# Patient Record
Sex: Female | Born: 1995
Health system: Southern US, Community
[De-identification: ages and names within clinical notes are randomized; demographics above are authoritative.]

## PROBLEM LIST (undated history)

## (undated) DIAGNOSIS — Z789 Other specified health status: Secondary | ICD-10-CM

## (undated) HISTORY — PX: TONSILLECTOMY: SUR1361

---

## 2014-09-13 ENCOUNTER — Emergency Department (HOSPITAL_COMMUNITY)
Admission: EM | Admit: 2014-09-13 | Discharge: 2014-09-14 | Disposition: A | Discharge status: Home / Self Care | Attending: Emergency Medicine | Admitting: Emergency Medicine

## 2014-09-13 ENCOUNTER — Encounter (HOSPITAL_COMMUNITY): Payer: Self-pay | Admitting: Emergency Medicine

## 2014-09-13 DIAGNOSIS — R059 Cough, unspecified: Secondary | ICD-10-CM

## 2014-09-13 DIAGNOSIS — J029 Acute pharyngitis, unspecified: Secondary | ICD-10-CM | POA: Diagnosis present

## 2014-09-13 DIAGNOSIS — M542 Cervicalgia: Secondary | ICD-10-CM | POA: Diagnosis not present

## 2014-09-13 DIAGNOSIS — R51 Headache: Secondary | ICD-10-CM | POA: Insufficient documentation

## 2014-09-13 DIAGNOSIS — J069 Acute upper respiratory infection, unspecified: Secondary | ICD-10-CM | POA: Insufficient documentation

## 2014-09-13 DIAGNOSIS — R05 Cough: Secondary | ICD-10-CM

## 2014-09-13 DIAGNOSIS — R0981 Nasal congestion: Secondary | ICD-10-CM

## 2014-09-13 LAB — RAPID STREP SCREEN (MED CTR MEBANE ONLY): Streptococcus, Group A Screen (Direct): NEGATIVE

## 2014-09-13 NOTE — ED Notes (Signed)
Pt. reports sore throat with dry cough and runny nose onset last week . Denies fever or chills. Airway intact / respirations unlabored .

## 2014-09-14 MED ORDER — SODIUM CHLORIDE-SODIUM BICARB 2300-700 MG NA KIT: 1.0000 "application " | PACK | Freq: Three times a day (TID) | NASAL | Status: DC | PRN

## 2014-09-14 MED ORDER — FLUTICASONE PROPIONATE 50 MCG/ACT NA SUSP: 2.0000 | Freq: Every day | NASAL | Status: DC

## 2014-09-14 NOTE — ED Provider Notes (Signed)
CSN: [id number]     Arrival date & time 09/13/14  2217 History   First MD Initiated Contact with Patient 09/13/14 2337     Chief Complaint  Patient presents with  . Sore Throat     (Consider location/radiation/quality/duration/timing/severity/associated sxs/prior Treatment) HPI Comments: Mariah Barnett is a 18 y.o. female with no significant PMHx, and a PSHx of T&A, who presents to the ED with complains of one week of URI symptoms consisting of a sore throat, clear rhinorrhea, dry cough, neck pain with LAD, sinus congestion, and mild intermittent headache. She'll force that her throat is 6/10 constant scratchy nonradiating pain worse with swallowing and unrelieved by Robitussin, Tylenol, and halls. She denies any fevers, chills, drooling, trismus, voice changes, eye pain or itching, eye discharge, ear pain or discharge, wheezing, chest pain, shortness of breath, sputum production, abdominal pain, nausea, vomiting, diarrhea, myalgias, arthralgias, or rashes. She has no known sick contacts, but she does attend school. UTD on shots. No medical issues or immunosuppressants.  Patient is a 18 y.o. female presenting with URI. The history is provided by the patient. No language interpreter was used.  URI Presenting symptoms: congestion, cough, rhinorrhea and sore throat   Presenting symptoms: no ear pain, no facial pain, no fatigue and no fever   Congestion:    Location:  Nasal Cough:    Cough characteristics:  Dry   Severity:  Mild   Onset quality:  Gradual   Duration:  1 week   Timing:  Intermittent   Progression:  Unchanged   Chronicity:  New Rhinorrhea:    Quality:  Clear   Severity:  Mild   Duration:  1 week   Timing:  Constant   Progression:  Unchanged Sore throat:    Severity:  Mild   Onset quality:  Gradual   Duration:  1 week   Timing:  Constant   Progression:  Unchanged Severity:  Mild Onset quality:  Gradual Duration:  1 week Timing:  Constant Progression:   Unchanged Chronicity:  New Relieved by:  Nothing Worsened by:  Drinking Ineffective treatments:  OTC medications and decongestant (tylenol and halls and robitussin) Associated symptoms: headaches (mild, frontal), neck pain, sinus pain and swollen glands   Associated symptoms: no arthralgias, no myalgias, no sneezing and no wheezing   Risk factors: no chronic respiratory disease, no immunosuppression, no recent illness, no recent travel and no sick contacts     History reviewed. No pertinent past medical history. History reviewed. No pertinent past surgical history. No family history on file. History  Substance Use Topics  . Smoking status: Never Smoker   . Smokeless tobacco: Not on file  . Alcohol Use: No   OB History    No data available     Review of Systems  Constitutional: Negative for fever, chills, appetite change and fatigue.  HENT: Positive for congestion, rhinorrhea and sore throat. Negative for dental problem, ear discharge, ear pain, mouth sores, postnasal drip, sinus pressure, sneezing, tinnitus, trouble swallowing and voice change.   Eyes: Negative for photophobia, pain, discharge, redness, itching and visual disturbance.  Respiratory: Positive for cough. Negative for chest tightness, shortness of breath and wheezing.   Cardiovascular: Negative for chest pain.  Gastrointestinal: Negative for nausea, vomiting and abdominal pain.  Musculoskeletal: Positive for neck pain. Negative for myalgias, arthralgias and neck stiffness.  Skin: Negative for rash.  Neurological: Positive for headaches (mild, frontal). Negative for dizziness, syncope, weakness, light-headedness and numbness.  Hematological: Positive for adenopathy.  Psychiatric/Behavioral: Negative  for confusion.   10 Systems reviewed and are negative for acute change except as noted in the HPI.    Allergies  Review of patient's allergies indicates no known allergies.  Home Medications   Prior to Admission  medications   Not on File   BP 122/73 mmHg  Pulse 78  Temp(Src) 98.5 F (36.9 C) (Oral)  Resp 16  Ht _0  (1.702 m)  Wt 174 lb (78.926 kg)  BMI 27.25 kg/m2  SpO2 100%  LMP 09/06/2014 Physical Exam  Constitutional: She is oriented to person, place, and time. Vital signs are normal. She appears well-developed and well-nourished.  Non-toxic appearance. No distress.  Afebrile, nontoxic, NAD  HENT:  Head: Normocephalic and atraumatic.  Right Ear: Hearing, tympanic membrane, external ear and ear canal normal.  Left Ear: Hearing, tympanic membrane, external ear and ear canal normal.  Nose: Mucosal edema and rhinorrhea present. No sinus tenderness. Right sinus exhibits no maxillary sinus tenderness and no frontal sinus tenderness. Left sinus exhibits no maxillary sinus tenderness and no frontal sinus tenderness.  Mouth/Throat: Uvula is midline and mucous membranes are normal. No trismus in the jaw. No uvula swelling. Posterior oropharyngeal erythema present.  Ears clear bilaterally B/l nasal turbinates edematous and erythematous with minimal clear rhinorrhea, no TTP over sinuses Oropharynx without tonsils, minimally injected without exudates, no PTA. MMM. No trismus or drooling, no uvular swelling or deviation.  Eyes: Conjunctivae and EOM are normal. Pupils are equal, round, and reactive to light. Right eye exhibits no discharge. Left eye exhibits no discharge.  Neck: Normal range of motion. Neck supple.  Cardiovascular: Normal rate, regular rhythm, normal heart sounds and intact distal pulses.  Exam reveals no gallop and no friction rub.   No murmur heard. Pulmonary/Chest: Effort normal and breath sounds normal. No respiratory distress. She has no decreased breath sounds. She has no wheezes. She has no rhonchi. She has no rales.  CTAB in all lung fields  Abdominal: Soft. Normal appearance and bowel sounds are normal. She exhibits no distension. There is no tenderness. There is no rigidity,  no rebound, no guarding, no tenderness at McBurney's point and negative Murphy's sign.  Musculoskeletal: Normal range of motion.  Lymphadenopathy:       Head (right side): Tonsillar adenopathy present.       Head (left side): Tonsillar adenopathy present.    She has cervical adenopathy.  Minimal shotty anterior superficial cervical and tonsillar LAD, mildly TTP  Neurological: She is alert and oriented to person, place, and time. She has normal strength. No sensory deficit.  Skin: Skin is warm, dry and intact. No rash noted.  Psychiatric: She has a normal mood and affect.  Nursing note and vitals reviewed.   ED Course  Procedures (including critical care time) Labs Review Labs Reviewed  RAPID STREP SCREEN  CULTURE, GROUP A STREP    Imaging Review No results found.   EKG Interpretation None      MDM   Final diagnoses:  Nasal sinus congestion  Sore throat  Viral URI  Cough    18y/o female with URI symptoms. Pt has no tonsils but RST done in triage, and was neg. Highly doubt strep, will not empirically tx. Pt is afebrile with a clear lung exam. Mild rhinorrhea with sinus congestion. Likely viral URI. Pt is agreeable to symptomatic treatment with close follow up with PCP as needed but spoke at length about emergent changing or worsening of symptoms that should prompt return to ER. Pt voices  understanding and is agreeable to plan. Stable at time of discharge.   BP 122/73 mmHg  Pulse 78  Temp(Src) 98.5 F (36.9 C) (Oral)  Resp 16  Ht _0  (1.702 m)  Wt 174 lb (78.926 kg)  BMI 27.25 kg/m2  SpO2 100%  LMP 09/06/2014  Meds ordered this encounter  Medications  . Sodium Chloride-Sodium Bicarb (NETI POT SINUS WASH) 2300-700 MG KIT    Sig: Place 1 application into the nose 3 (three) times daily as needed (sinus congestion).    Dispense:  1 each    Refill:  2    Order Specific Question:  Supervising Provider    Answer:  Noemi Chapel D [[id number]]  . fluticasone (FLONASE) 50  MCG/ACT nasal spray    Sig: Place 2 sprays into both nostrils daily.    Dispense:  16 g    Refill:  0    Order Specific Question:  Supervising Provider    Answer:  Johnna Acosta [address] [place], PA-C 09/14/14 [id number]  Everlene Balls, MD 09/14/14 [phone]

## 2014-09-14 NOTE — ED Notes (Signed)
Patient states she has had a cough and runny nose for about 1 week.  Denies fever  Denies difficulty breathing.

## 2014-09-14 NOTE — Discharge Instructions (Signed)
Continue to stay well-hydrated. Gargle warm salt water and spit it out. Continue to alternate between Tylenol and Ibuprofen for pain or fever. May use Mucinex for cough suppression/expectoration of mucus. Use netipot and flonase to help with nasal congestion. May consider over-the-counter Benadryl or other antihistamine to decrease secretions and for watery itchy eyes. Followup with your primary care doctor or campus clinic doctor in 5-7 days for recheck of ongoing symptoms. Return to emergency department for emergent changing or worsening of symptoms.   Upper Respiratory Infection, Adult An upper respiratory infection (URI) is also sometimes known as the common cold. The upper respiratory tract includes the nose, sinuses, throat, trachea, and bronchi. Bronchi are the airways leading to the lungs. Most people improve within 1 week, but symptoms can last up to 2 weeks. A residual cough may last even longer.  CAUSES Many different viruses can infect the tissues lining the upper respiratory tract. The tissues become irritated and inflamed and often become very moist. Mucus production is also common. A cold is contagious. You can easily spread the virus to others by oral contact. This includes kissing, sharing a glass, coughing, or sneezing. Touching your mouth or nose and then touching a surface, which is then touched by another person, can also spread the virus. SYMPTOMS  Symptoms typically develop 1 to 3 days after you come in contact with a cold virus. Symptoms vary from person to person. They may include:  Runny nose.  Sneezing.  Nasal congestion.  Sinus irritation.  Sore throat.  Loss of voice (laryngitis).  Cough.  Fatigue.  Muscle aches.  Loss of appetite.  Headache.  Low-grade fever. DIAGNOSIS  You might diagnose your own cold based on familiar symptoms, since most people get a cold 2 to 3 times a year. Your caregiver can confirm this based on your exam. Most importantly, your  caregiver can check that your symptoms are not due to another disease such as strep throat, sinusitis, pneumonia, asthma, or epiglottitis. Blood tests, throat tests, and X-rays are not necessary to diagnose a common cold, but they may sometimes be helpful in excluding other more serious diseases. Your caregiver will decide if any further tests are required. RISKS AND COMPLICATIONS  You may be at risk for a more severe case of the common cold if you smoke cigarettes, have chronic heart disease (such as heart failure) or lung disease (such as asthma), or if you have a weakened immune system. The very young and very old are also at risk for more serious infections. Bacterial sinusitis, middle ear infections, and bacterial pneumonia can complicate the common cold. The common cold can worsen asthma and chronic obstructive pulmonary disease (COPD). Sometimes, these complications can require emergency medical care and may be life-threatening. PREVENTION  The best way to protect against getting a cold is to practice good hygiene. Avoid oral or hand contact with people with cold symptoms. Wash your hands often if contact occurs. There is no clear evidence that vitamin C, vitamin E, echinacea, or exercise reduces the chance of developing a cold. However, it is always recommended to get plenty of rest and practice good nutrition. TREATMENT  Treatment is directed at relieving symptoms. There is no cure. Antibiotics are not effective, because the infection is caused by a virus, not by bacteria. Treatment may include:  Increased fluid intake. Sports drinks offer valuable electrolytes, sugars, and fluids.  Breathing heated mist or steam (vaporizer or shower).  Eating chicken soup or other clear broths, and maintaining good  nutrition.  Getting plenty of rest.  Using gargles or lozenges for comfort.  Controlling fevers with ibuprofen or acetaminophen as directed by your caregiver.  Increasing usage of your  inhaler if you have asthma. Zinc gel and zinc lozenges, taken in the first 24 hours of the common cold, can shorten the duration and lessen the severity of symptoms. Pain medicines may help with fever, muscle aches, and throat pain. A variety of non-prescription medicines are available to treat congestion and runny nose. Your caregiver can make recommendations and may suggest nasal or lung inhalers for other symptoms.  HOME CARE INSTRUCTIONS   Only take over-the-counter or prescription medicines for pain, discomfort, or fever as directed by your caregiver.  Use a warm mist humidifier or inhale steam from a shower to increase air moisture. This may keep secretions moist and make it easier to breathe.  Drink enough water and fluids to keep your urine clear or pale yellow.  Rest as needed.  Return to work when your temperature has returned to normal or as your caregiver advises. You may need to stay home longer to avoid infecting others. You can also use a face mask and careful hand washing to prevent spread of the virus. SEEK MEDICAL CARE IF:   After the first few days, you feel you are getting worse rather than better.  You need your caregiver's advice about medicines to control symptoms.  You develop chills, worsening shortness of breath, or brown or red sputum. These may be signs of pneumonia.  You develop yellow or brown nasal discharge or pain in the face, especially when you bend forward. These may be signs of sinusitis.  You develop a fever, swollen neck glands, pain with swallowing, or white areas in the back of your throat. These may be signs of strep throat. SEEK IMMEDIATE MEDICAL CARE IF:   You have a fever.  You develop severe or persistent headache, ear pain, sinus pain, or chest pain.  You develop wheezing, a prolonged cough, cough up blood, or have a change in your usual mucus (if you have chronic lung disease).  You develop sore muscles or a stiff neck. Document  Released: 04/14/2001 Document Revised: 01/11/2012 Document Reviewed: 01/24/2014 [facility] Patient Information 2015 [place], [place]. This information is not intended to replace advice given to you by your health care provider. Make sure you discuss any questions you have with your health care provider.  Sore Throat A sore throat is a painful, burning, sore, or scratchy feeling of the throat. There may be pain or tenderness when swallowing or talking. You may have other symptoms with a sore throat. These include coughing, sneezing, fever, or a swollen neck. A sore throat is often the first sign of another sickness. These sicknesses may include a cold, flu, strep throat, or an infection called mono. Most sore throats go away without medical treatment.  HOME CARE   Only take medicine as told by your doctor.  Drink enough fluids to keep your pee (urine) clear or pale yellow.  Rest as needed.  Try using throat sprays, lozenges, or suck on hard candy (if older than 4 years or as told).  Sip warm liquids, such as broth, herbal tea, or warm water with honey. Try sucking on frozen ice pops or drinking cold liquids.  Rinse the mouth (gargle) with salt water. Mix 1 teaspoon salt with 8 ounces of water.  Do not smoke. Avoid being around others when they are smoking.  Put a humidifier in  your bedroom at night to moisten the air. You can also turn on a hot shower and sit in the bathroom for 5-10 minutes. Be sure the bathroom door is closed. GET HELP RIGHT AWAY IF:   You have trouble breathing.  You cannot swallow fluids, soft foods, or your spit (saliva).  You have more puffiness (swelling) in the throat.  Your sore throat does not get better in 7 days.  You feel sick to your stomach (nauseous) and throw up (vomit).  You have a fever or lasting symptoms for more than 2-3 days.  You have a fever and your symptoms suddenly get worse. MAKE SURE YOU:   Understand these instructions.  Will watch  your condition.  Will get help right away if you are not doing well or get worse. Document Released: 07/28/2008 Document Revised: 07/13/2012 Document Reviewed: 06/26/2012 [facility] Patient Information 2015 [place], [place]. This information is not intended to replace advice given to you by your health care provider. Make sure you discuss any questions you have with your health care provider.  Salt Water Gargle This solution will help make your mouth and throat feel better. HOME CARE INSTRUCTIONS   Mix 1 teaspoon of salt in 8 ounces of warm water.  Gargle with this solution as much or often as you need or as directed. Swish and gargle gently if you have any sores or wounds in your mouth.  Do not swallow this mixture. Document Released: 07/23/2004 Document Revised: 01/11/2012 Document Reviewed: 12/14/2008 [facility]ExitCare Patient Information 2015 [place], [place]. This information is not intended to replace advice given to you by your health care provider. Make sure you discuss any questions you have with your health care provider.

## 2014-09-14 NOTE — ED Notes (Signed)
Reviewed prescriptions and discharge instructions, voiced understanding.

## 2014-09-16 LAB — CULTURE, GROUP A STREP

## 2016-12-21 ENCOUNTER — Encounter (HOSPITAL_COMMUNITY): Payer: Self-pay | Admitting: Emergency Medicine

## 2016-12-21 ENCOUNTER — Ambulatory Visit (HOSPITAL_COMMUNITY)
Admission: EM | Admit: 2016-12-21 | Discharge: 2016-12-21 | Disposition: A | Discharge status: Home / Self Care | Attending: Internal Medicine | Admitting: Internal Medicine

## 2016-12-21 DIAGNOSIS — M436 Torticollis: Secondary | ICD-10-CM

## 2016-12-21 MED ORDER — DICLOFENAC SODIUM 75 MG PO TBEC: 75.0000 mg | DELAYED_RELEASE_TABLET | Freq: Two times a day (BID) | ORAL | 0 refills | Status: DC

## 2016-12-21 MED ORDER — KETOROLAC TROMETHAMINE 30 MG/ML IJ SOLN
INTRAMUSCULAR | Status: AC
  Filled 2016-12-21: qty 1

## 2016-12-21 MED ORDER — KETOROLAC TROMETHAMINE 30 MG/ML IJ SOLN
30.0000 mg | Freq: Once | INTRAMUSCULAR | Status: AC
  Administered 2016-12-21: 30 mg via INTRAMUSCULAR

## 2016-12-21 MED ORDER — DEXAMETHASONE SODIUM PHOSPHATE 10 MG/ML IJ SOLN
10.0000 mg | Freq: Once | INTRAMUSCULAR | Status: AC
  Administered 2016-12-21: 10 mg via INTRAMUSCULAR

## 2016-12-21 MED ORDER — CYCLOBENZAPRINE HCL 10 MG PO TABS: 10.0000 mg | ORAL_TABLET | Freq: Two times a day (BID) | ORAL | 0 refills | Status: DC | PRN

## 2016-12-21 MED ORDER — DEXAMETHASONE SODIUM PHOSPHATE 10 MG/ML IJ SOLN
INTRAMUSCULAR | Status: AC
  Filled 2016-12-21: qty 1

## 2016-12-21 NOTE — ED Notes (Signed)
At discharge, patient wanted to sit for a few minutes "I dont like needles" provided patient with ice/water, coke and ice chips, graham crackers and peanut butter.

## 2016-12-21 NOTE — ED Provider Notes (Signed)
CSN: [id number]     Arrival date & time 12/21/16  1652 History   First MD Initiated Contact with Patient 12/21/16 1839     Chief Complaint  Patient presents with  . Torticollis   (Consider location/radiation/quality/duration/timing/severity/associated sxs/prior Treatment) 21 year old female presents with a chief complaint of neck stiffness for 5 days, states she awoke from sleep one morning with a stiff neck, denies history of trauma, denies history of fever, headache, or other symptoms. She has had no loss of sensation in her extremities, no loss of bowel or bladder control, she is able to move her head right to left flex and extend with some pain.   The history is provided by the patient.    History reviewed. No pertinent past medical history. History reviewed. No pertinent surgical history. History reviewed. No pertinent family history. Social History  Substance Use Topics  . Smoking status: Never Smoker  . Smokeless tobacco: Not on file  . Alcohol use No   OB History    No data available     Review of Systems  Reason unable to perform ROS: as covered in HPI.  All other systems reviewed and are negative.   Allergies  Patient has no known allergies.  Home Medications   Prior to Admission medications   Medication Sig Start Date End Date Taking? Authorizing Provider  cyclobenzaprine (FLEXERIL) 10 MG tablet Take 1 tablet (10 mg total) by mouth 2 (two) times daily as needed for muscle spasms. 12/21/16   Dorena BodoLawrence Lorene Klimas, NP  diclofenac (VOLTAREN) 75 MG EC tablet Take 1 tablet (75 mg total) by mouth 2 (two) times daily. 12/21/16   Dorena BodoLawrence Lachanda Buczek, NP   Meds Ordered and Administered this Visit   Medications  ketorolac (TORADOL) 30 MG/ML injection 30 mg (30 mg Intramuscular Given 12/21/16 1903)  dexamethasone (DECADRON) injection 10 mg (10 mg Intramuscular Given 12/21/16 1901)    BP 105/61 (BP Location: Right Arm)   Pulse 66   Temp 98.5 F (36.9 C) (Oral)   Resp 18    SpO2 100%  No data found.   Physical Exam  Constitutional: She is oriented to person, place, and time. She appears well-developed and well-nourished. No distress.  HENT:  Head: Normocephalic and atraumatic.  Neck: Normal range of motion.  Cardiovascular: Normal rate and regular rhythm.   Pulmonary/Chest: Effort normal and breath sounds normal.  Musculoskeletal:       Cervical back: She exhibits tenderness, pain and spasm.       Back:  Neurological: She is alert and oriented to person, place, and time.  Skin: Skin is warm and dry. Capillary refill takes less than 2 seconds. She is not diaphoretic.  Nursing note and vitals reviewed.   Urgent Care Course     Procedures (including critical care time)  Labs Review Labs Reviewed - No data to display  Imaging Review No results found.   Visual Acuity Review  Right Eye Distance:   Left Eye Distance:   Bilateral Distance:    Right Eye Near:   Left Eye Near:    Bilateral Near:         MDM   1. Torticollis    You most likely have a strained muscle in your neck causing a condition called torticollis. I have prescribed two medicines for your pain. The first is diclofenac, take 1 tablet twice a day and the other is Flexeril, take 1 tablet twice a day. Flexeril may cause drowsiness so do not drive until you know  how this medicine affects you. Also do not drink any alcohol either. You may apply ice and alternate with heat for 15 minutes at a time 4 times daily and for additional pain control you may take tylenol over the counter ever 4 hours but do not take more than 4000 mg a day. Should your pain continue or fail to resolve, follow up with your primary care provider or return to clinic as needed.       Dorena Bodo, NP 12/21/16 1910

## 2016-12-21 NOTE — ED Notes (Signed)
On discharge, patient felt better.  Lawrence, NP was aware of patient not feeling well initially and aware when patient was ready for discharge.  Lyman BishopLawrence, NP agreeable to patient leaving.

## 2016-12-21 NOTE — ED Triage Notes (Signed)
[facility]. The patient denied any known injury.

## 2016-12-21 NOTE — Discharge Instructions (Signed)
You most likely have a strained muscle in your neck causing a condition called torticollis. I have prescribed two medicines for your pain. The first is diclofenac, take 1 tablet twice a day and the other is Flexeril, take 1 tablet twice a day. Flexeril may cause drowsiness so do not drive until you know how this medicine affects you. Also do not drink any alcohol either. You may apply ice and alternate with heat for 15 minutes at a time 4 times daily and for additional pain control you may take tylenol over the counter ever 4 hours but do not take more than 4000 mg a day. Should your pain continue or fail to resolve, follow up with your primary care provider or return to clinic as needed.

## 2017-10-06 ENCOUNTER — Other Ambulatory Visit: Payer: Self-pay

## 2017-10-06 ENCOUNTER — Ambulatory Visit (HOSPITAL_COMMUNITY)
Admission: EM | Admit: 2017-10-06 | Discharge: 2017-10-06 | Disposition: A | Discharge status: Home / Self Care | Attending: Family Medicine | Admitting: Family Medicine

## 2017-10-06 ENCOUNTER — Encounter (HOSPITAL_COMMUNITY): Payer: Self-pay | Admitting: *Deleted

## 2017-10-06 DIAGNOSIS — J069 Acute upper respiratory infection, unspecified: Secondary | ICD-10-CM | POA: Diagnosis not present

## 2017-10-06 DIAGNOSIS — R0982 Postnasal drip: Secondary | ICD-10-CM

## 2017-10-06 NOTE — ED Triage Notes (Signed)
Per pt Sore throat, running nose, congestions started Monday this week.

## 2017-10-06 NOTE — ED Provider Notes (Signed)
[facility]    CSN: [id number] Arrival date & time: 10/06/17  1234     History   Chief Complaint Chief Complaint  Patient presents with  . URI    HPI Mariah Barnett is a 21 y.o. female.   21 year old female complaining of URI symptoms for 2-3 days. Chief complaint include sore throat, cough and runny nose. No fever or chills. Has taken some OTC meds without relief.      History reviewed. No pertinent past medical history.  There are no active problems to display for this patient.   History reviewed. No pertinent surgical history.  OB History    No data available       Home Medications    Prior to Admission medications   Medication Sig Start Date End Date Taking? Authorizing Provider  cyclobenzaprine (FLEXERIL) 10 MG tablet Take 1 tablet (10 mg total) by mouth 2 (two) times daily as needed for muscle spasms. 12/21/16   Dorena BodoKennard, [place], NP  diclofenac (VOLTAREN) 75 MG EC tablet Take 1 tablet (75 mg total) by mouth 2 (two) times daily. 12/21/16   Dorena BodoKennard, Lawrence, NP    Family History History reviewed. No pertinent family history.  Social History Social History   Tobacco Use  . Smoking status: Never Smoker  . Smokeless tobacco: Never Used  Substance Use Topics  . Alcohol use: No  . Drug use: No     Allergies   Patient has no known allergies.   Review of Systems Review of Systems  Constitutional: Negative for activity change, appetite change, chills, fatigue and fever.  HENT: Positive for congestion, postnasal drip, rhinorrhea and sore throat. Negative for facial swelling.   Eyes: Negative.   Respiratory: Negative.   Cardiovascular: Negative.   Musculoskeletal: Negative for neck pain and neck stiffness.  Skin: Negative for pallor and rash.  Neurological: Negative.   All other systems reviewed and are negative.    Physical Exam Triage Vital Signs ED Triage Vitals  Enc Vitals Group     BP --      Pulse Rate 10/06/17 1323 76       Resp --      Temp 10/06/17 1323 98.7 F (37.1 C)     Temp src --      SpO2 10/06/17 1323 98 %     Weight --      Height --      Head Circumference --      Peak Flow --      Pain Score 10/06/17 1319 4     Pain Loc --      Pain Edu? --      Excl. in GC? --    No data found.  Updated Vital Signs Pulse 76   Temp 98.7 F (37.1 C)   LMP 09/09/2017 (Approximate)   SpO2 98%   Visual Acuity Right Eye Distance:   Left Eye Distance:   Bilateral Distance:    Right Eye Near:   Left Eye Near:    Bilateral Near:     Physical Exam  Constitutional: She is oriented to person, place, and time. She appears well-developed and well-nourished. No distress.  HENT:  Oropharynx with minor erythema and light PND. No exudates or swelling Bilateral TMs normal  Neck: Normal range of motion. Neck supple.  Cardiovascular: Normal rate and regular rhythm.  Pulmonary/Chest: Effort normal and breath sounds normal. No respiratory distress.  Musculoskeletal: Normal range of motion. She exhibits no edema.  Lymphadenopathy:  She has no cervical adenopathy.  Neurological: She is alert and oriented to person, place, and time.  Skin: Skin is warm and dry. No rash noted.  Psychiatric: She has a normal mood and affect.  Nursing note and vitals reviewed.    UC Treatments / Results  Labs (all labs ordered are listed, but only abnormal results are displayed) Labs Reviewed - No data to display  EKG  EKG Interpretation None       Radiology No results found.  Procedures Procedures (including critical care time)  Medications Ordered in UC Medications - No data to display   Initial Impression / Assessment and Plan / UC Course  I have reviewed the triage vital signs and the nursing notes.  Pertinent labs & imaging results that were available during my care of the patient were reviewed by me and considered in my medical decision making (see chart for details).    Read instructions. The  following medications can help with your symptoms. Sudafed PE 10 mg every 4 to 6 hours as needed for congestion Allegra or Zyrtec daily as needed for drainage and runny nose. For stronger antihistamine may take Chlor-Trimeton 2 to 4 mg every 4 to 6 hours, may cause drowsiness.S Saline nasal spray used frequently. Drink plenty of fluids and stay well-hydrated.    Final Clinical Impressions(s) / UC Diagnoses   Final diagnoses:  Viral upper respiratory tract infection  PND (post-nasal drip)    ED Discharge Orders    None       Controlled Substance Prescriptions [place] Controlled Substance Registry consulted? Not Applicable   Hayden RasmussenMabe, Aurie Harroun, NP 10/06/17 [phone]

## 2017-10-06 NOTE — Discharge Instructions (Addendum)
Read instructions. The following medications can help with your symptoms. Sudafed PE 10 mg every 4 to 6 hours as needed for congestion Allegra or Zyrtec daily as needed for drainage and runny nose. For stronger antihistamine may take Chlor-Trimeton 2 to 4 mg every 4 to 6 hours, may cause drowsiness.S Saline nasal spray used frequently. Drink plenty of fluids and stay well-hydrated.

## 2018-03-17 ENCOUNTER — Encounter (HOSPITAL_COMMUNITY): Payer: Self-pay | Admitting: Family Medicine

## 2018-03-17 ENCOUNTER — Ambulatory Visit (HOSPITAL_COMMUNITY)
Admission: EM | Admit: 2018-03-17 | Discharge: 2018-03-17 | Disposition: A | Discharge status: Home / Self Care | Attending: Family Medicine | Admitting: Family Medicine

## 2018-03-17 DIAGNOSIS — M546 Pain in thoracic spine: Secondary | ICD-10-CM

## 2018-03-17 MED ORDER — IBUPROFEN 800 MG PO TABS: 800.0000 mg | ORAL_TABLET | Freq: Three times a day (TID) | ORAL | 0 refills | Status: DC

## 2018-03-17 MED ORDER — CYCLOBENZAPRINE HCL 10 MG PO TABS: 10.0000 mg | ORAL_TABLET | Freq: Two times a day (BID) | ORAL | 0 refills | Status: DC | PRN

## 2018-03-17 NOTE — Discharge Instructions (Signed)
Use anti-inflammatories for pain/swelling. You may take up to 800 mg Ibuprofen every 8 hours with food. You may supplement Ibuprofen with Tylenol [phone] mg every 8 hours.   You may use flexeril as needed to help with pain. This is a muscle relaxer and causes sedation- please use only at bedtime or when you will be home and not have to drive/work  You may apply ice and heating pads to upper back  Return if pain not improving in 1-2 weeks or worsening.

## 2018-03-17 NOTE — ED Triage Notes (Signed)
Pt here for mid upper back pain since yesterday. She hasn't taken anything for the pain. sts that 2 weeks ago she sneezed and took some muscle relaxer and the pain decreased but returned.

## 2018-03-17 NOTE — ED Provider Notes (Signed)
[facility]    CSN: [id number] Arrival date & time: 03/17/18  1001     History   Chief Complaint Chief Complaint  Patient presents with  . Back Pain    HPI Mariah Barnett is a 22 y.o. female noncontributing past medical history presenting today for evaluation of upper back pain.  Patient states that pain began yesterday while she was sitting down.  Denies any injury or increase in activity.  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  Denies any neck pain or neck stiffness.  Denies any coughing or shortness of breath.  Patient has tried methocarbamol without relief.  Denies any radiation into her arms, denies any numbness and tingling.  HPI  History reviewed. No pertinent past medical history.  There are no active problems to display for this patient.   History reviewed. No pertinent surgical history.  OB History   None      Home Medications    Prior to Admission medications   Medication Sig Start Date End Date Taking? Authorizing Provider  cyclobenzaprine (FLEXERIL) 10 MG tablet Take 1 tablet (10 mg total) by mouth 2 (two) times daily as needed for muscle spasms. 03/17/18   Sophiamarie Nease C, PA-C  diclofenac (VOLTAREN) 75 MG EC tablet Take 1 tablet (75 mg total) by mouth 2 (two) times daily. 12/21/16   Dorena Bodo, NP  ibuprofen (ADVIL,MOTRIN) 800 MG tablet Take 1 tablet (800 mg total) by mouth 3 (three) times daily. 03/17/18   Yanel Dombrosky, Junius Creamer, PA-C    Family History History reviewed. No pertinent family history.  Social History Social History   Tobacco Use  . Smoking status: Never Smoker  . Smokeless tobacco: Never Used  Substance Use Topics  . Alcohol use: No  . Drug use: No     Allergies   Patient has no known allergies.   Review of Systems Review of Systems  Constitutional: Negative for activity change and appetite change.  HENT: Negative for trouble swallowing.   Eyes:  Negative for pain and visual disturbance.  Respiratory: Negative for shortness of breath.   Cardiovascular: Negative for chest pain.  Gastrointestinal: Negative for abdominal pain, nausea and vomiting.  Musculoskeletal: Positive for back pain and myalgias. Negative for arthralgias, gait problem, neck pain and neck stiffness.  Skin: Negative for color change and wound.  Neurological: Negative for dizziness, seizures, syncope, weakness, light-headedness, numbness and headaches.     Physical Exam Triage Vital Signs ED Triage Vitals  Enc Vitals Group     BP 03/17/18 1019 101/68     Pulse Rate 03/17/18 1019 80     Resp 03/17/18 1019 18     Temp 03/17/18 1019 98.2 F (36.8 C)     Temp Source 03/17/18 1019 Oral     SpO2 03/17/18 1019 98 %     Weight --      Height --      Head Circumference --      Peak Flow --      Pain Score 03/17/18 1017 8     Pain Loc --      Pain Edu? --      Excl. in GC? --    No data found.  Updated Vital Signs BP 101/68 (BP Location: Left Arm)   Pulse 80   Temp 98.2 F (36.8 C) (Oral)   Resp 18   LMP 02/24/2018   SpO2 98%   Visual Acuity Right  Eye Distance:   Left Eye Distance:   Bilateral Distance:    Right Eye Near:   Left Eye Near:    Bilateral Near:     Physical Exam  Constitutional: She appears well-developed and well-nourished. No distress.  HENT:  Head: Normocephalic and atraumatic.  Eyes: Conjunctivae are normal.  Neck: Neck supple.  Cardiovascular: Normal rate and regular rhythm.  No murmur heard. Pulmonary/Chest: Effort normal and breath sounds normal. No respiratory distress.  Musculoskeletal: She exhibits no edema.  Mild tenderness to upper thoracic spine midline as well as bilateral thoracic musculature near scapula.  Full active range of motion of neck without eliciting pain, full active range of motion of shoulders.  Gait without abnormality.  Neurological: She is alert.  Skin: Skin is warm and dry.  Psychiatric: She has  a normal mood and affect.  Nursing note and vitals reviewed.    UC Treatments / Results  Labs (all labs ordered are listed, but only abnormal results are displayed) Labs Reviewed - No data to display  EKG None  Radiology No results found.  Procedures Procedures (including critical care time)  Medications Ordered in UC Medications - No data to display  Initial Impression / Assessment and Plan / UC Course  I have reviewed the triage vital signs and the nursing notes.  Pertinent labs & imaging results that were available during my care of the patient were reviewed by me and considered in my medical decision making (see chart for details).     Patient likely with muscular strain of thoracic spine.  Will recommend conservative treatment with NSAIDs, Flexeril as needed.  Discussed sedation regarding Flexeril. Discussed strict return precautions. Patient verbalized understanding and is agreeable with plan.  Final Clinical Impressions(s) / UC Diagnoses   Final diagnoses:  Acute bilateral thoracic back pain     Discharge Instructions     Use anti-inflammatories for pain/swelling. You may take up to 800 mg Ibuprofen every 8 hours with food. You may supplement Ibuprofen with Tylenol [phone] mg every 8 hours.   You may use flexeril as needed to help with pain. This is a muscle relaxer and causes sedation- please use only at bedtime or when you will be home and not have to drive/work  You may apply ice and heating pads to upper back  Return if pain not improving in 1-2 weeks or worsening.   ED Prescriptions    Medication Sig Dispense Auth. Provider   cyclobenzaprine (FLEXERIL) 10 MG tablet Take 1 tablet (10 mg total) by mouth 2 (two) times daily as needed for muscle spasms. 20 tablet Jaice Lague C, PA-C   ibuprofen (ADVIL,MOTRIN) 800 MG tablet Take 1 tablet (800 mg total) by mouth 3 (three) times daily. 30 tablet Qunisha Bryk, Dexter C, PA-C     Controlled Substance  Prescriptions [place] Controlled Substance Registry consulted? Not Applicable   Lew Dawes, [place] 03/17/18 1031

## 2018-12-02 DIAGNOSIS — R109 Unspecified abdominal pain: Secondary | ICD-10-CM | POA: Diagnosis not present

## 2018-12-14 DIAGNOSIS — Z01419 Encounter for gynecological examination (general) (routine) without abnormal findings: Secondary | ICD-10-CM | POA: Diagnosis not present

## 2018-12-14 DIAGNOSIS — Z6826 Body mass index (BMI) 26.0-26.9, adult: Secondary | ICD-10-CM | POA: Diagnosis not present

## 2018-12-14 DIAGNOSIS — Z118 Encounter for screening for other infectious and parasitic diseases: Secondary | ICD-10-CM | POA: Diagnosis not present

## 2018-12-21 DIAGNOSIS — Z23 Encounter for immunization: Secondary | ICD-10-CM | POA: Diagnosis not present

## 2018-12-21 DIAGNOSIS — Z113 Encounter for screening for infections with a predominantly sexual mode of transmission: Secondary | ICD-10-CM | POA: Diagnosis not present

## 2018-12-21 DIAGNOSIS — Z114 Encounter for screening for human immunodeficiency virus [HIV]: Secondary | ICD-10-CM | POA: Diagnosis not present

## 2018-12-21 DIAGNOSIS — Z1159 Encounter for screening for other viral diseases: Secondary | ICD-10-CM | POA: Diagnosis not present

## 2019-04-19 DIAGNOSIS — Z23 Encounter for immunization: Secondary | ICD-10-CM | POA: Diagnosis not present

## 2019-08-21 DIAGNOSIS — Z23 Encounter for immunization: Secondary | ICD-10-CM | POA: Diagnosis not present

## 2019-11-15 ENCOUNTER — Encounter (HOSPITAL_COMMUNITY): Payer: Self-pay | Admitting: Emergency Medicine

## 2019-11-15 ENCOUNTER — Ambulatory Visit (HOSPITAL_COMMUNITY)
Admission: EM | Admit: 2019-11-15 | Discharge: 2019-11-15 | Disposition: A | Discharge status: Home / Self Care | Payer: BC Managed Care – PPO

## 2019-11-15 ENCOUNTER — Other Ambulatory Visit: Payer: Self-pay

## 2019-11-15 DIAGNOSIS — M542 Cervicalgia: Secondary | ICD-10-CM

## 2019-11-15 DIAGNOSIS — M62838 Other muscle spasm: Secondary | ICD-10-CM

## 2019-11-15 MED ORDER — CYCLOBENZAPRINE HCL 5 MG PO TABS: 5.0000 mg | ORAL_TABLET | Freq: Two times a day (BID) | ORAL | 0 refills | Status: DC | PRN

## 2019-11-15 MED ORDER — NAPROXEN 500 MG PO TABS: 500.0000 mg | ORAL_TABLET | Freq: Two times a day (BID) | ORAL | 0 refills | Status: DC

## 2019-11-15 NOTE — ED Provider Notes (Signed)
  [facility]   MRN: [id number] DOB: [birth year]  Subjective:   Mariah Barnett is a 24 y.o. female presenting for several day history of mild persistent tightness-like pain of her upper back and neck.  Patient states that left side is more bothersome than the right.  Denies any falls, trauma, weakness, radicular symptoms.  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   She is not currently taking any medications and has no known food or drug allergies.  Denies past medical and surgical history.  History reviewed. No pertinent family history.  Social History   Tobacco Use  . Smoking status: Never Smoker  . Smokeless tobacco: Never Used  Substance Use Topics  . Alcohol use: Yes  . Drug use: No    ROS   Objective:   Vitals: BP 118/84 (BP Location: Right Arm)   Pulse 81   Temp 98.4 F (36.9 C) (Oral)   Resp 18   LMP 10/12/2019   SpO2 98%   Physical Exam Constitutional:      General: She is not in acute distress.    Appearance: Normal appearance. She is well-developed. She is not ill-appearing, toxic-appearing or diaphoretic.  HENT:     Head: Normocephalic and atraumatic.     Nose: Nose normal.     Mouth/Throat:     Mouth: Mucous membranes are moist.     Pharynx: Oropharynx is clear.  Eyes:     General: No scleral icterus.    Extraocular Movements: Extraocular movements intact.     Pupils: Pupils are equal, round, and reactive to light.  Cardiovascular:     Rate and Rhythm: Normal rate.  Pulmonary:     Effort: Pulmonary effort is normal.  Musculoskeletal:     Cervical back: Spasms and tenderness (about paraspinal mucles) present. No swelling, edema, deformity, erythema, signs of trauma, lacerations, rigidity, torticollis, bony tenderness or crepitus. No pain with movement. Normal range of motion.     Comments: Negative Spurling maneuver, Lhermitte's sign.  Skin:  General: Skin is warm and dry.  Neurological:     General: No focal deficit present.     Mental Status: She is alert and oriented to person, place, and time.  Psychiatric:        Mood and Affect: Mood normal.        Behavior: Behavior normal.     Assessment and Plan :   1. Cervical pain (neck)   2. Neck pain   3. Spasm of cervical paraspinous muscle     Start naproxen and Flexeril for conservative management of neck pain related to the nature of her work.  Patient was agreeable to hold off on x-ray given lack of fall, trauma.  Patient has very reassuring physical exam findings.  Recommended changing work positions to avoid further stress on her neck. Counseled patient on potential for adverse effects with medications prescribed/recommended today, ER and return-to-clinic precautions discussed, patient verbalized understanding.    Wallis Bamberg, PA-C 11/15/19 1519

## 2019-11-15 NOTE — ED Triage Notes (Signed)
Patient woke with back pain, no known injury.  Pain is upper back and neck, felt on both sides, but left side more prominent.  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

## 2020-02-13 DIAGNOSIS — Z118 Encounter for screening for other infectious and parasitic diseases: Secondary | ICD-10-CM | POA: Diagnosis not present

## 2020-02-13 DIAGNOSIS — N760 Acute vaginitis: Secondary | ICD-10-CM | POA: Diagnosis not present

## 2020-02-13 DIAGNOSIS — Z6827 Body mass index (BMI) 27.0-27.9, adult: Secondary | ICD-10-CM | POA: Diagnosis not present

## 2020-02-13 DIAGNOSIS — Z01419 Encounter for gynecological examination (general) (routine) without abnormal findings: Secondary | ICD-10-CM | POA: Diagnosis not present

## 2020-05-20 ENCOUNTER — Ambulatory Visit (HOSPITAL_COMMUNITY)
Admission: EM | Admit: 2020-05-20 | Discharge: 2020-05-20 | Disposition: A | Discharge status: Home / Self Care | Payer: BC Managed Care – PPO | Attending: Family Medicine | Admitting: Family Medicine

## 2020-05-20 ENCOUNTER — Encounter (HOSPITAL_COMMUNITY): Payer: Self-pay

## 2020-05-20 ENCOUNTER — Other Ambulatory Visit: Payer: Self-pay

## 2020-05-20 DIAGNOSIS — R071 Chest pain on breathing: Secondary | ICD-10-CM | POA: Diagnosis not present

## 2020-05-20 DIAGNOSIS — M7918 Myalgia, other site: Secondary | ICD-10-CM

## 2020-05-20 MED ORDER — PREDNISONE 20 MG PO TABS: 20.0000 mg | ORAL_TABLET | Freq: Two times a day (BID) | ORAL | 0 refills | Status: DC

## 2020-05-20 NOTE — ED Provider Notes (Signed)
[facility]    CSN: [id number] Arrival date & time: 05/20/20  1520      History   Chief Complaint Chief Complaint  Patient presents with   Chest Pain    HPI Mariah Barnett is a 24 y.o. female.   HPI  Patient is here for chest pain.  It is in the front of her chest at her sternum, upper region.  She states that she had an upper respiratory infection cold last week.  She did a lot of coughing.  It caused her to have some pain in her chest.  No every time she takes a deep breath she has some pain in her chest.  She no longer is coughing.  No sputum.  No fever or chills.  No nausea or vomiting. She does not have any heart disease.  No pain with exertion. Patient does have chronic thoracic pain.  She states she has been seen a couple times for this.  She has not had any x-rays or imaging.  She takes anti-inflammatories and muscle relaxers as needed.  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 I recommended that the patient obtain medical care through sports medicine and possible physical therapy to see about this.  She may benefit from injections, manipulation, stretching, and or exercise therapy.  She states that under her insurance she needs a primary care to get a referral.  I recommend that her primary care doctor be an Clinical cytogeneticist.  History reviewed. No pertinent past medical history.  There are no problems to display for this patient.   History reviewed. No pertinent surgical history.  OB History   No obstetric history on file.      Home Medications    Prior to Admission medications   Medication Sig Start Date End Date Taking? Authorizing Provider  NON FORMULARY Birth control pill    [provider]  predniSONE (DELTASONE) 20 MG tablet Take 1 tablet (20 mg total) by mouth 2 (two) times daily with a meal. 05/20/20   Raylene Everts, MD    Family  History No family history on file.  Social History Social History   Tobacco Use   Smoking status: Never Smoker   Smokeless tobacco: Never Used  Scientific laboratory technician Use: Never used  Substance Use Topics   Alcohol use: Yes    Comment: occ   Drug use: No     Allergies   Patient has no known allergies.   Review of Systems Review of Systems See HPI  Physical Exam Triage Vital Signs ED Triage Vitals [05/20/20 1538]  Enc Vitals Group     BP 121/74     Pulse Rate 78     Resp 18     Temp 98.3 F (36.8 C)     Temp Source Oral     SpO2 97 %     Weight 180 lb (81.6 kg)     Height '5\' 7"'$  (1.702 m)     Head Circumference      Peak Flow      Pain Score 6     Pain Loc      Pain Edu?      Excl. in [place]?    No data found.  Updated Vital Signs BP 121/74    Pulse 78    Temp 98.3 F (36.8 C) (Oral)    Resp  18    Ht '5\' 7"'$  (1.702 m)    Wt 81.6 kg    SpO2 97%    BMI 28.19 kg/m      Physical Exam Constitutional:      General: She is not in acute distress.    Appearance: She is well-developed and normal weight.  HENT:     Head: Normocephalic and atraumatic.     Mouth/Throat:     Comments: Mask in place Eyes:     Conjunctiva/sclera: Conjunctivae normal.     Pupils: Pupils are equal, round, and reactive to light.  Neck:     Comments: Tenderness bilaterally in the upper body trapezius and the medial border of the scapula Cardiovascular:     Rate and Rhythm: Normal rate and regular rhythm.     Heart sounds: Normal heart sounds.  Pulmonary:     Effort: Pulmonary effort is normal. No respiratory distress.     Breath sounds: Normal breath sounds.  Chest:     Chest wall: No tenderness.  Abdominal:     General: There is no distension.     Palpations: Abdomen is soft.  Musculoskeletal:        General: Normal range of motion.     Cervical back: Normal range of motion.  Skin:    General: Skin is warm and dry.  Neurological:     General: No focal deficit present.      Mental Status: She is alert.  Psychiatric:        Mood and Affect: Mood normal.        Behavior: Behavior normal.      UC Treatments / Results  Labs (all labs ordered are listed, but only abnormal results are displayed) Labs Reviewed - No data to display  EKG   Radiology No results found.  Procedures Procedures (including critical care time)  Medications Ordered in UC Medications - No data to display  Initial Impression / Assessment and Plan / UC Course  I have reviewed the triage vital signs and the nursing notes.  Pertinent labs & imaging results that were available during my care of the patient were reviewed by me and considered in my medical decision making (see chart for details).     I told the patient met the differential diagnosis of chest pain.  This is clearly noncardiac.  After respiratory infection she likely has some mild pleuritic pain versus costochondral pain.  Will treat with anti-inflammatories I also discussed her myofascial pain.  Referral recommended Final Clinical Impressions(s) / UC Diagnoses   Final diagnoses:  Chest pain on breathing  Myofascial pain syndrome of thoracic spine     Discharge Instructions     Take the prednisone for 5 days After this take aleve 2 pills every 12 hours if needed  See PCP in follow up    ED Prescriptions    Medication Sig Dispense Auth. Provider   predniSONE (DELTASONE) 20 MG tablet Take 1 tablet (20 mg total) by mouth 2 (two) times daily with a meal. 10 tablet Raylene Everts, MD     PDMP not reviewed this encounter.   Raylene Everts, MD 05/20/20 2051

## 2020-05-20 NOTE — ED Triage Notes (Signed)
Pt c/o midsternal chest tightness with inspirationx1 wk. Pt states the back pain is chronic. Pt c/o 6/10 sharp mid upper back painx1wk. Pt denies N/V/SOB. Pt has non labored breathing.

## 2020-05-20 NOTE — Discharge Instructions (Signed)
Take the prednisone for 5 days After this take aleve 2 pills every 12 hours if needed  See PCP in follow up

## 2020-07-18 DIAGNOSIS — N760 Acute vaginitis: Secondary | ICD-10-CM | POA: Diagnosis not present

## 2020-11-08 ENCOUNTER — Other Ambulatory Visit: Payer: Self-pay

## 2020-11-08 ENCOUNTER — Ambulatory Visit (HOSPITAL_COMMUNITY)
Admission: EM | Admit: 2020-11-08 | Discharge: 2020-11-08 | Disposition: A | Discharge status: Home / Self Care | Payer: 59 | Attending: Family Medicine | Admitting: Family Medicine

## 2020-11-08 DIAGNOSIS — R059 Cough, unspecified: Secondary | ICD-10-CM | POA: Diagnosis not present

## 2020-11-08 DIAGNOSIS — U071 COVID-19: Secondary | ICD-10-CM

## 2020-11-08 DIAGNOSIS — R0602 Shortness of breath: Secondary | ICD-10-CM

## 2020-11-08 MED ORDER — BENZONATATE 100 MG PO CAPS: 100.0000 mg | ORAL_CAPSULE | Freq: Three times a day (TID) | ORAL | 0 refills | Status: DC | PRN

## 2020-11-08 MED ORDER — PREDNISONE 50 MG PO TABS: 50.0000 mg | ORAL_TABLET | Freq: Every day | ORAL | 0 refills | Status: DC

## 2020-11-08 MED ORDER — ALBUTEROL SULFATE HFA 108 (90 BASE) MCG/ACT IN AERS
2.0000 | INHALATION_SPRAY | Freq: Once | RESPIRATORY_TRACT | Status: AC
  Administered 2020-11-08: 2 via RESPIRATORY_TRACT

## 2020-11-08 MED ORDER — ALBUTEROL SULFATE HFA 108 (90 BASE) MCG/ACT IN AERS
INHALATION_SPRAY | RESPIRATORY_TRACT | Status: AC
  Filled 2020-11-08: qty 6.7

## 2020-11-08 NOTE — ED Triage Notes (Signed)
Pt reports testing positive for COVID on Wed. At [facility] . Pt presents today with dry cough that keeps her awake at night and SHOB.

## 2020-11-08 NOTE — ED Provider Notes (Signed)
[facility]    CSN: [id number] Arrival date & time: 11/08/20  0809      History   Chief Complaint Chief Complaint  Patient presents with  . Cough  . Shortness of Breath    HPI Mariah Barnett is a 25 y.o. female.   HPI  Patient presents today with a concern of shortness of breath and a persistent cough since being diagnosed with COVID-19 x2 days ago.  Symptoms originally started on Sunday.  She has been free of fever however has noticed over the last 2 days increased work of breathing with some shortness of breath with which is present mostly at nighttime.  Her cough is nonproductive.  No history of any chronic respiratory conditions.  No past medical history on file.  There are no problems to display for this patient.   No past surgical history on file.  OB History   No obstetric history on file.      Home Medications    Prior to Admission medications   Medication Sig Start Date End Date Taking? Authorizing Provider  NON FORMULARY Birth control pill    [provider]  predniSONE (DELTASONE) 20 MG tablet Take 1 tablet (20 mg total) by mouth 2 (two) times daily with a meal. 05/20/20   Eustace Moore, MD    Family History No family history on file.  Social History Social History   Tobacco Use  . Smoking status: Never Smoker  . Smokeless tobacco: Never Used  Vaping Use  . Vaping Use: Never used  Substance Use Topics  . Alcohol use: Yes    Comment: occ  . Drug use: No     Allergies   Patient has no known allergies.   Review of Systems Review of Systems Pertinent negatives listed in HPI   Physical Exam Triage Vital Signs ED Triage Vitals  Enc Vitals Group     BP 11/08/20 0834 115/72     Pulse Rate 11/08/20 0834 72     Resp 11/08/20 0834 18     Temp 11/08/20 0834 98.3 F (36.8 C)     Temp Source 11/08/20 0834 Oral     SpO2 11/08/20 0834 97 %     Weight 11/08/20 0837 185 lb (83.9 kg)     Height 11/08/20 0837 5\' 7"   (1.702 m)     Head Circumference --      Peak Flow --      Pain Score 11/08/20 0837 0     Pain Loc --      Pain Edu? --      Excl. in GC? --    No data found.  Updated Vital Signs BP 115/72 (BP Location: Right Arm)   Pulse 72   Temp 98.3 F (36.8 C) (Oral)   Resp 18   Ht 5\' 7"  (1.702 m)   Wt 185 lb (83.9 kg)   LMP 10/17/2020   SpO2 97%   BMI 28.98 kg/m   Visual Acuity Right Eye Distance:   Left Eye Distance:   Bilateral Distance:    Right Eye Near:   Left Eye Near:    Bilateral Near:     Physical Exam General appearance: alert, well developed, ill-appearing,  cooperative and in no distress Head: Normocephalic, without obvious abnormality, atraumatic Respiratory: Respirations even and unlabored, normal respiratory rate, coarse lung sounds Heart: Rate and rhythm normal. No gallop or murmurs noted on exam  Abdomen: BS +, no distention, no rebound tenderness, or no mass  Extremities: No gross deformities Skin: Skin color, texture, turgor normal. No rashes seen  Psych: Appropriate mood and affect. Neurologic: Mental status: Alert, oriented to person, place, and time, thought content appropriate.   UC Treatments / Results  Labs (all labs ordered are listed, but only abnormal results are displayed) Labs Reviewed - No data to display  EKG   Radiology No results found.  Procedures Procedures (including critical care time)  Medications Ordered in UC Medications - No data to display  Initial Impression / Assessment and Plan / UC Course  I have reviewed the triage vital signs and the nursing notes.  Pertinent labs & imaging results that were available during my care of the patient were reviewed by me and considered in my medical decision making (see chart for details).    COVID-19 positive. Symptom management warranted only.  Manage fever with Motrin and ibuprofen.  Nasal symptoms with over-the-counter antihistamines recommended.  Treatment per discharge  medications/discharge instructions.  Red flags/ER precautions given. The most current CDC isolation/quarantine recommendation advised.  Work note provided indicating return to work date of 11/11/2020. Final Clinical Impressions(s) / UC Diagnoses   Final diagnoses:  COVID-19 virus infection  Shortness of breath  Cough   Discharge Instructions   None    ED Prescriptions    Medication Sig Dispense Auth. Provider   benzonatate (TESSALON) 100 MG capsule Take 1-2 capsules (100-200 mg total) by mouth 3 (three) times daily as needed for cough. 40 capsule Bing Neighbors, FNP   predniSONE (DELTASONE) 50 MG tablet Take 1 tablet (50 mg total) by mouth daily with breakfast. 5 tablet Bing Neighbors, FNP     PDMP not reviewed this encounter.   Bing Neighbors, [place] 11/08/20 [phone]

## 2020-11-15 ENCOUNTER — Ambulatory Visit (HOSPITAL_COMMUNITY)
Admission: EM | Admit: 2020-11-15 | Discharge: 2020-11-15 | Disposition: A | Discharge status: Home / Self Care | Payer: 59 | Attending: Emergency Medicine | Admitting: Emergency Medicine

## 2020-11-15 ENCOUNTER — Ambulatory Visit (INDEPENDENT_AMBULATORY_CARE_PROVIDER_SITE_OTHER): Payer: 59

## 2020-11-15 ENCOUNTER — Encounter (HOSPITAL_COMMUNITY): Payer: Self-pay | Admitting: Emergency Medicine

## 2020-11-15 ENCOUNTER — Other Ambulatory Visit: Payer: Self-pay

## 2020-11-15 DIAGNOSIS — R0602 Shortness of breath: Secondary | ICD-10-CM

## 2020-11-15 DIAGNOSIS — U071 COVID-19: Secondary | ICD-10-CM

## 2020-11-15 DIAGNOSIS — R059 Cough, unspecified: Secondary | ICD-10-CM | POA: Diagnosis not present

## 2020-11-15 NOTE — Discharge Instructions (Addendum)
Your chest xray is normal.    Continue to use the albuterol inhaler as needed.    Follow up with your primary care provider if your symptoms are not improving.

## 2020-11-15 NOTE — ED Provider Notes (Signed)
[facility]    CSN: [id number] Arrival date & time: 11/15/20  0802      History   Chief Complaint Chief Complaint  Patient presents with  . Cough  . Shortness of Breath    HPI Mariah Barnett is a 25 y.o. female.   Patient presents with ongoing cough and shortness of breath x2 weeks.  She denies fever, chills, abdominal pain, vomiting, diarrhea, or other symptoms.  Patient was seen here on 11/08/2020; per patient report COVID positive on 11/06/20; diagnosed with COVID-19, shortness of breath, cough; treated prednisone and Tessalon Perles.  No other pertinent medical history.  The history is provided by the patient and medical records.    History reviewed. No pertinent past medical history.  There are no problems to display for this patient.   History reviewed. No pertinent surgical history.  OB History   No obstetric history on file.      Home Medications    Prior to Admission medications   Medication Sig Start Date End Date Taking? Authorizing Provider  benzonatate (TESSALON) 100 MG capsule Take 1-2 capsules (100-200 mg total) by mouth 3 (three) times daily as needed for cough. 11/08/20  Yes Bing Neighbors, FNP  NON FORMULARY Birth control pill   Yes [provider]  predniSONE (DELTASONE) 50 MG tablet Take 1 tablet (50 mg total) by mouth daily with breakfast. 11/08/20   Bing Neighbors, FNP    Family History History reviewed. No pertinent family history.  Social History Social History   Tobacco Use  . Smoking status: Never Smoker  . Smokeless tobacco: Never Used  Vaping Use  . Vaping Use: Never used  Substance Use Topics  . Alcohol use: Yes    Comment: occ  . Drug use: No     Allergies   Patient has no known allergies.   Review of Systems Review of Systems  Constitutional: Negative for chills and fever.  HENT: Negative for ear pain and sore throat.   Eyes: Negative for pain and visual disturbance.  Respiratory: Positive for  cough and shortness of breath.   Cardiovascular: Negative for chest pain and palpitations.  Gastrointestinal: Negative for abdominal pain, diarrhea and vomiting.  Genitourinary: Negative for dysuria and hematuria.  Musculoskeletal: Negative for arthralgias and back pain.  Skin: Negative for color change and rash.  Neurological: Negative for seizures and syncope.  All other systems reviewed and are negative.    Physical Exam Triage Vital Signs ED Triage Vitals  Enc Vitals Group     BP      Pulse      Resp      Temp      Temp src      SpO2      Weight      Height      Head Circumference      Peak Flow      Pain Score      Pain Loc      Pain Edu?      Excl. in GC?    No data found.  Updated Vital Signs BP 112/74 (BP Location: Right Arm)   Pulse 86   Temp 98.6 F (37 C) (Oral)   Resp 18   Ht 5\' 7"  (1.702 m)   Wt 185 lb (83.9 kg)   LMP 11/14/2020   SpO2 98%   BMI 28.98 kg/m   Visual Acuity Right Eye Distance:   Left Eye Distance:   Bilateral Distance:  Right Eye Near:   Left Eye Near:    Bilateral Near:     Physical Exam Vitals and nursing note reviewed.  Constitutional:      General: She is not in acute distress.    Appearance: She is well-developed and well-nourished. She is not ill-appearing.  HENT:     Head: Normocephalic and atraumatic.     Mouth/Throat:     Mouth: Mucous membranes are moist.  Eyes:     Conjunctiva/sclera: Conjunctivae normal.  Cardiovascular:     Rate and Rhythm: Normal rate and regular rhythm.     Heart sounds: Normal heart sounds.  Pulmonary:     Effort: Pulmonary effort is normal. No respiratory distress.     Breath sounds: Normal breath sounds. No wheezing or rhonchi.  Abdominal:     Palpations: Abdomen is soft.     Tenderness: There is no abdominal tenderness.  Musculoskeletal:        General: No edema.     Cervical back: Neck supple.  Skin:    General: Skin is warm and dry.  Neurological:     General: No focal  deficit present.     Mental Status: She is alert and oriented to person, place, and time.  Psychiatric:        Mood and Affect: Mood and affect and mood normal.        Behavior: Behavior normal.      UC Treatments / Results  Labs (all labs ordered are listed, but only abnormal results are displayed) Labs Reviewed - No data to display  EKG   Radiology DG Chest 2 View  Result Date: 11/15/2020 CLINICAL DATA:  Shortness of breath, COVID-19 positive EXAM: CHEST - 2 VIEW COMPARISON:  None. FINDINGS: The heart size and mediastinal contours are within normal limits. Both lungs are clear. The visualized skeletal structures are unremarkable. IMPRESSION: No active cardiopulmonary disease. Electronically Signed   By: Duanne Guess D.O.   On: 11/15/2020 09:04    Procedures Procedures (including critical care time)  Medications Ordered in UC Medications - No data to display  Initial Impression / Assessment and Plan / UC Course  I have reviewed the triage vital signs and the nursing notes.  Pertinent labs & imaging results that were available during my care of the patient were reviewed by me and considered in my medical decision making (see chart for details).    Cough, shortness of breath.  Chest x-ray normal.  Instructed patient to continue using the albuterol inhaler as directed.  Instructed her to follow-up with her PCP if her symptoms are not improving.  She agrees to plan of care.   Final Clinical Impressions(s) / UC Diagnoses   Final diagnoses:  Cough  Shortness of breath     Discharge Instructions     Your chest xray is normal.    Continue to use the albuterol inhaler as needed.    Follow up with your primary care provider if your symptoms are not improving.       ED Prescriptions    None     PDMP not reviewed this encounter.   Mickie Bail, NP 11/15/20 [phone]

## 2020-11-15 NOTE — ED Triage Notes (Signed)
Patient c/o productive cough and SOB x 2 weeks.   Patient endorses having a COVID positive test result "the first week of January".   Patient endorses having clear "sputum production"  Patient was seen at this clinic and given a steroid and inhaler with no relief of symptoms.

## 2021-05-23 ENCOUNTER — Other Ambulatory Visit: Payer: Self-pay

## 2021-05-23 ENCOUNTER — Ambulatory Visit (HOSPITAL_COMMUNITY)
Admission: EM | Admit: 2021-05-23 | Discharge: 2021-05-23 | Disposition: A | Discharge status: Home / Self Care | Payer: 59 | Attending: Emergency Medicine | Admitting: Emergency Medicine

## 2021-05-23 ENCOUNTER — Encounter (HOSPITAL_COMMUNITY): Payer: Self-pay | Admitting: Emergency Medicine

## 2021-05-23 DIAGNOSIS — M62830 Muscle spasm of back: Secondary | ICD-10-CM

## 2021-05-23 MED ORDER — TIZANIDINE HCL 4 MG PO TABS: 4.0000 mg | ORAL_TABLET | Freq: Four times a day (QID) | ORAL | 0 refills | Status: DC | PRN

## 2021-05-23 NOTE — ED Triage Notes (Signed)
Pt presents with upper back and neck pain xs 3-4 days. Denies any fall or injury.

## 2021-05-23 NOTE — ED Provider Notes (Signed)
[facility]    CSN: [id number] Arrival date & time: 05/23/21  0800      History   Chief Complaint Chief Complaint  Patient presents with   Back Pain   Torticollis    HPI Mariah Barnett is a 25 y.o. female.   Patient here for evaluation for upper back and neck pain that has been ongoing for the past 3-4 days.  Reports pain is achy and worse with movement.  Denies any pain or numbness that radiates down arms.  Reports taking some muscle relaxer that she had been previously prescribed which was helpful.  Denies any trauma, injury, or other precipitating event.  Denies any fevers, chest pain, shortness of breath, N/V/D, numbness, tingling, weakness, abdominal pain, or headaches.     The history is provided by the patient.  Back Pain  History reviewed. No pertinent past medical history.  There are no problems to display for this patient.   History reviewed. No pertinent surgical history.  OB History   No obstetric history on file.      Home Medications    Prior to Admission medications   Medication Sig Start Date End Date Taking? Authorizing Provider  tiZANidine (ZANAFLEX) 4 MG tablet Take 1 tablet (4 mg total) by mouth every 6 (six) hours as needed for muscle spasms. 05/23/21  Yes Ivette Loyal, NP  benzonatate (TESSALON) 100 MG capsule Take 1-2 capsules (100-200 mg total) by mouth 3 (three) times daily as needed for cough. 11/08/20   Bing Neighbors, FNP  NON FORMULARY Birth control pill    [provider]  predniSONE (DELTASONE) 50 MG tablet Take 1 tablet (50 mg total) by mouth daily with breakfast. 11/08/20   Bing Neighbors, FNP    Family History History reviewed. No pertinent family history.  Social History Social History   Tobacco Use   Smoking status: Never   Smokeless tobacco: Never  Vaping Use   Vaping Use: Never used  Substance Use Topics   Alcohol use: Yes    Comment: occ   Drug use: No     Allergies   Patient has no  known allergies.   Review of Systems Review of Systems  Musculoskeletal:  Positive for back pain. Negative for neck stiffness.  All other systems reviewed and are negative.   Physical Exam Triage Vital Signs ED Triage Vitals  Enc Vitals Group     BP 05/23/21 0827 118/67     Pulse Rate 05/23/21 0827 73     Resp 05/23/21 0827 16     Temp 05/23/21 0827 98.6 F (37 C)     Temp Source 05/23/21 0827 Oral     SpO2 05/23/21 0827 100 %     Weight --      Height --      Head Circumference --      Peak Flow --      Pain Score 05/23/21 0824 6     Pain Loc --      Pain Edu? --      Excl. in GC? --    No data found.  Updated Vital Signs BP 118/67 (BP Location: Right Arm)   Pulse 73   Temp 98.6 F (37 C) (Oral)   Resp 16   LMP 05/01/2021   SpO2 100%   Visual Acuity Right Eye Distance:   Left Eye Distance:   Bilateral Distance:    Right Eye Near:   Left Eye Near:    Bilateral  Near:     Physical Exam Vitals and nursing note reviewed.  Constitutional:      General: She is not in acute distress.    Appearance: Normal appearance. She is not ill-appearing, toxic-appearing or diaphoretic.  HENT:     Head: Normocephalic and atraumatic.  Eyes:     Conjunctiva/sclera: Conjunctivae normal.  Cardiovascular:     Rate and Rhythm: Normal rate.     Pulses: Normal pulses.  Pulmonary:     Effort: Pulmonary effort is normal.  Abdominal:     General: Abdomen is flat.  Musculoskeletal:        General: Normal range of motion.     Cervical back: Normal range of motion. Spasms and tenderness (primarily left sided neck and shoulder tenderness) present. No bony tenderness or crepitus. No pain with movement. Normal range of motion.     Thoracic back: Normal. No bony tenderness.     Lumbar back: Normal. No bony tenderness.  Skin:    General: Skin is warm and dry.  Neurological:     General: No focal deficit present.     Mental Status: She is alert and oriented to person, place, and  time.  Psychiatric:        Mood and Affect: Mood normal.     UC Treatments / Results  Labs (all labs ordered are listed, but only abnormal results are displayed) Labs Reviewed - No data to display  EKG   Radiology No results found.  Procedures Procedures (including critical care time)  Medications Ordered in UC Medications - No data to display  Initial Impression / Assessment and Plan / UC Course  I have reviewed the triage vital signs and the nursing notes.  Pertinent labs & imaging results that were available during my care of the patient were reviewed by me and considered in my medical decision making (see chart for details).    Assessment negative red flags or concerns.  Likely muscle spasms of back.  Will treat with zanaflex as needed for muscle pain and spasms.  Instructed to not take  prior to driving as it can cause drowsiness.  May take Tylenol and/or Ibuprofen as needed.  Encouraged use of heat for comfort.  Encouraged fluids and rest.  Follow up as needed.   Final Clinical Impressions(s) / UC Diagnoses   Final diagnoses:  Muscle spasm of back     Discharge Instructions      Take the Zanaflex as needed for muscle pain and spasms.  Do not take it prior to driving as it can make you sleepy.    Take Ibuprofen and/or Tylenol as needed for pain relief and fever reduction.  You can also try heat for comfort.    Return or go to the Emergency Department if symptoms worsen or do not improve in the next few days.       ED Prescriptions     Medication Sig Dispense Auth. Provider   tiZANidine (ZANAFLEX) 4 MG tablet Take 1 tablet (4 mg total) by mouth every 6 (six) hours as needed for muscle spasms. 30 tablet Ivette Loyal, NP      PDMP not reviewed this encounter.   Ivette Loyal, NP 05/23/21 [phone]

## 2021-05-23 NOTE — Discharge Instructions (Addendum)
Take the Zanaflex as needed for muscle pain and spasms.  Do not take it prior to driving as it can make you sleepy.    Take Ibuprofen and/or Tylenol as needed for pain relief and fever reduction.  You can also try heat for comfort.    Return or go to the Emergency Department if symptoms worsen or do not improve in the next few days.

## 2021-06-07 IMAGING — DX DG CHEST 2V
2 series · 2 of 2 positions shown · non-contrast
Comparison: None.

CLINICAL DATA: Shortness of breath, [id number] positive

EXAM:
CHEST - 2 VIEW

[chest pa]
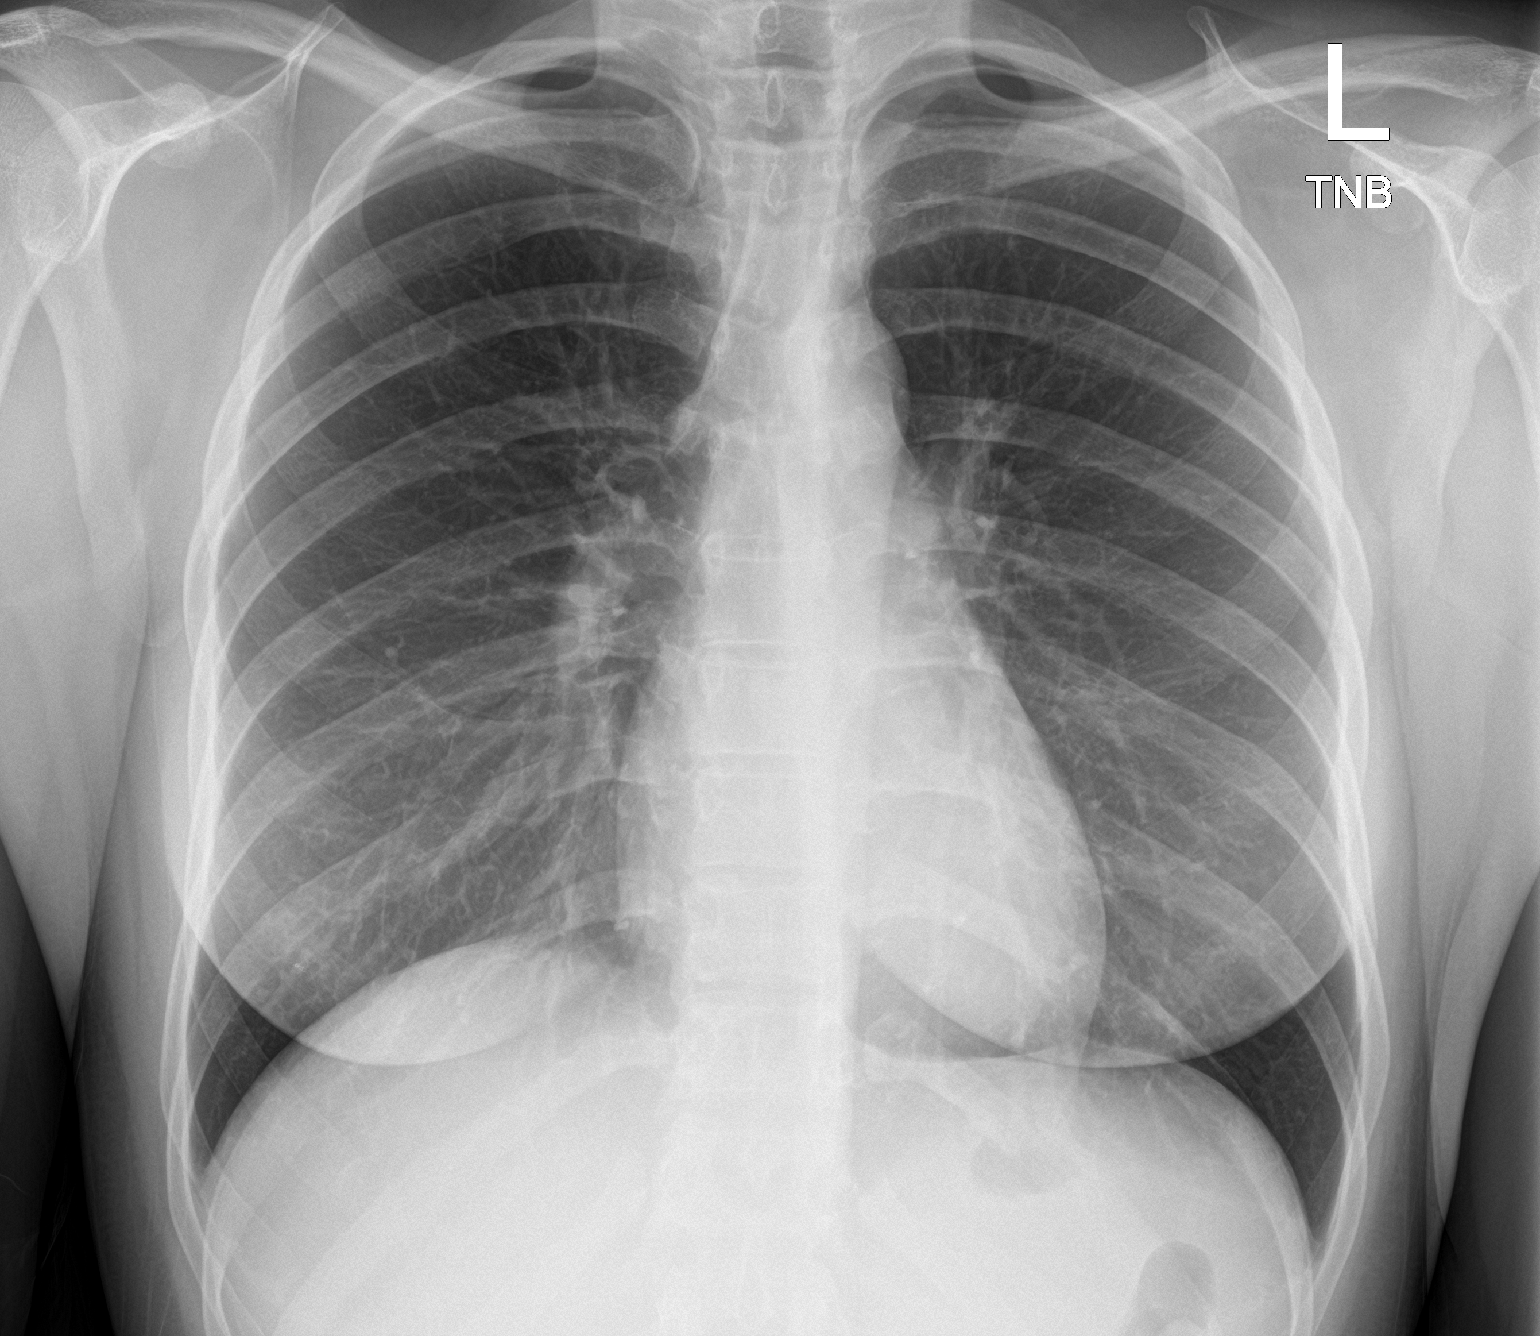

[chest lat]
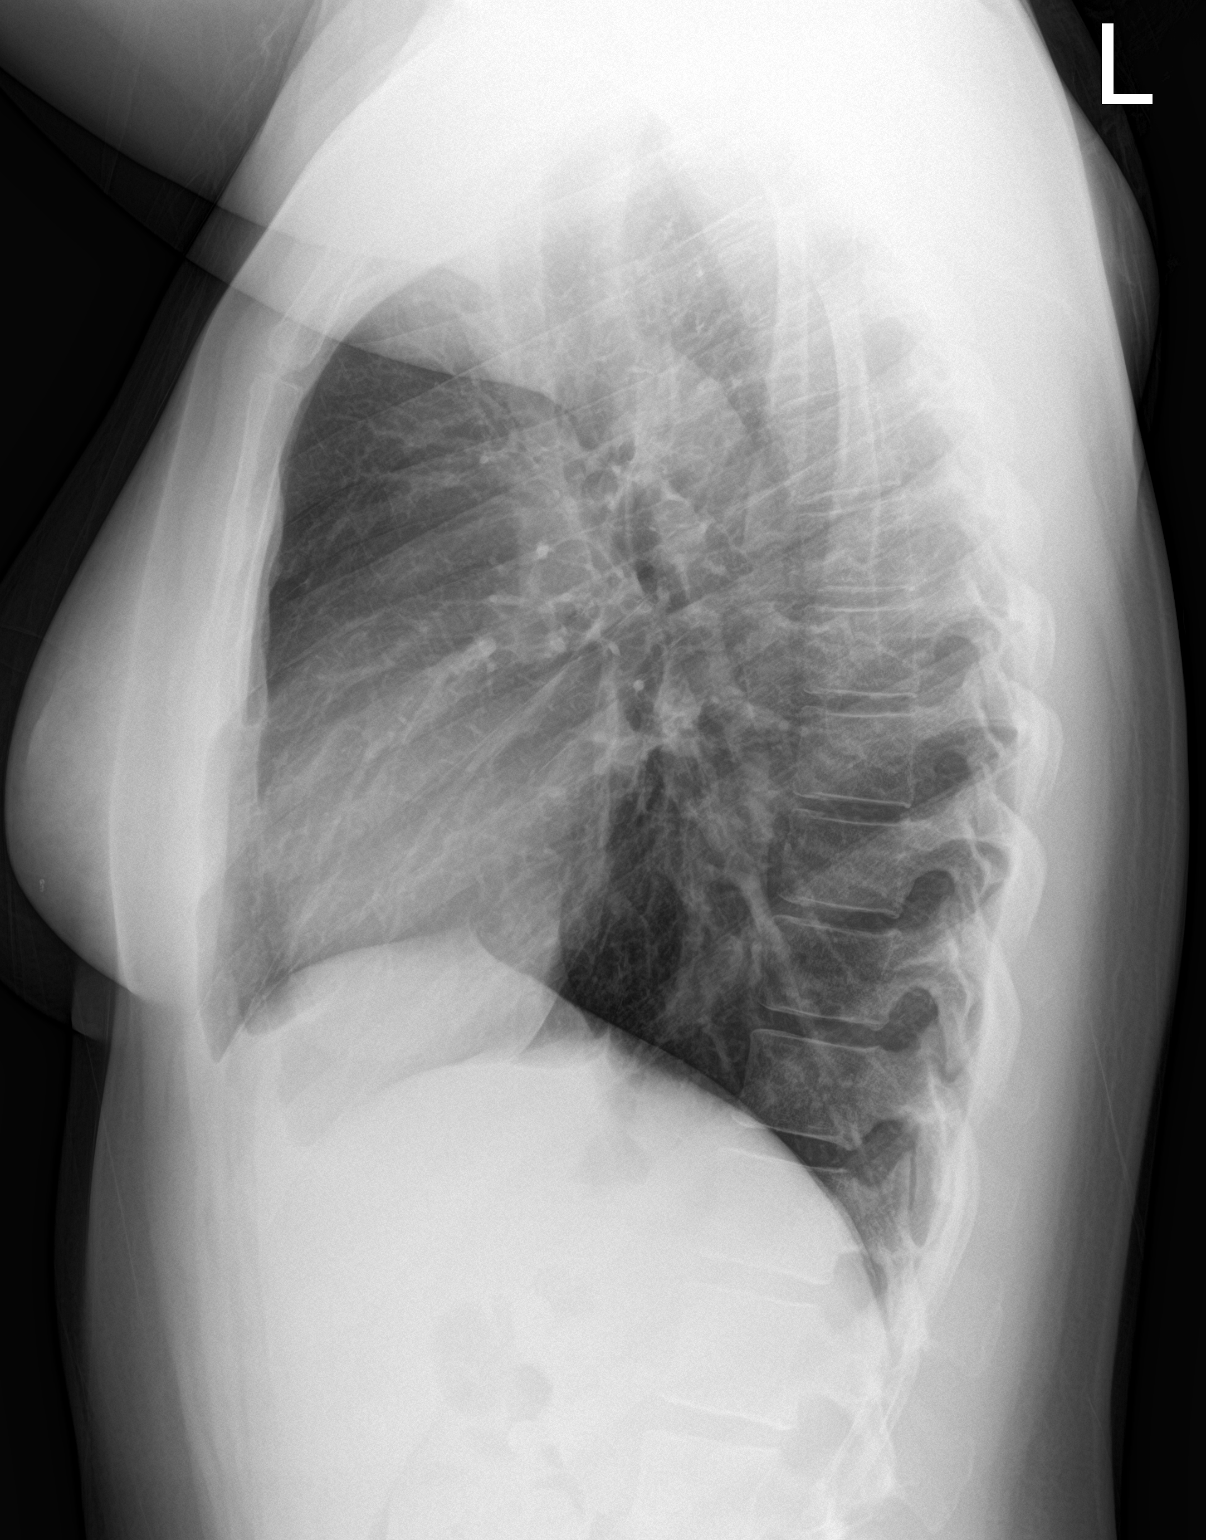

[2 of 2 positions shown; findings below may reference images not displayed]

FINDINGS: The heart size and mediastinal contours are within normal limits.
Both lungs are clear. The visualized skeletal structures are
unremarkable.
IMPRESSION: No active cardiopulmonary disease.

## 2023-08-13 ENCOUNTER — Ambulatory Visit (HOSPITAL_COMMUNITY)
Admission: EM | Admit: 2023-08-13 | Discharge: 2023-08-13 | Disposition: A | Discharge status: Home / Self Care | Payer: BC Managed Care – PPO | Attending: Emergency Medicine | Admitting: Emergency Medicine

## 2023-08-13 ENCOUNTER — Encounter (HOSPITAL_COMMUNITY): Payer: Self-pay | Admitting: Emergency Medicine

## 2023-08-13 DIAGNOSIS — S29012D Strain of muscle and tendon of back wall of thorax, subsequent encounter: Secondary | ICD-10-CM

## 2023-08-13 MED ORDER — METHOCARBAMOL 500 MG PO TABS: 500.0000 mg | ORAL_TABLET | Freq: Two times a day (BID) | ORAL | 0 refills | Status: DC

## 2023-08-13 MED ORDER — DEXAMETHASONE SODIUM PHOSPHATE 10 MG/ML IJ SOLN
INTRAMUSCULAR | Status: AC
  Filled 2023-08-13: qty 1

## 2023-08-13 MED ORDER — DEXAMETHASONE SODIUM PHOSPHATE 10 MG/ML IJ SOLN
10.0000 mg | Freq: Once | INTRAMUSCULAR | Status: AC
  Administered 2023-08-13: 10 mg via INTRAMUSCULAR

## 2023-08-13 NOTE — ED Triage Notes (Signed)
Patient c/o right upper back pain x2 weeks. Patient states worse in the past week. Patient denies any numbness or tingling of her arms.  Patient states she has been taking Tylenol for pain and lst took yesterday.Marland Kitchen

## 2023-08-13 NOTE — Discharge Instructions (Addendum)
You can take Robaxin twice daily as needed for back pain and spasms.  This can make you drowsy so do not work or drive while taking.  Otherwise alternate between ibuprofen and Tylenol every 4-6 hours for pain.  You can also apply heat or ice to help with inflammation causing her pain.  I have attached to [facility] who you can follow-up with if pain persists. Return here as needed.

## 2023-08-13 NOTE — ED Provider Notes (Signed)
[facility]    CSN: [id number] Arrival date & time: 08/13/23  0801      History   Chief Complaint Chief Complaint  Patient presents with   Back Pain    HPI Mariah Barnett is a 27 y.o. female.   Patient presents with right upper back pain that radiates to neck x 2 weeks.  Patient states pain became worse this past week.  Denies numbness or tingling to her arms.  Patient reports history of upper back pain due to injury on trampoline a few years ago.  Reports taking Tylenol with minimal relief.  Denies any recent injuries.   Back Pain Associated symptoms: no headaches, no numbness and no weakness     History reviewed. No pertinent past medical history.  There are no problems to display for this patient.   History reviewed. No pertinent surgical history.  OB History   No obstetric history on file.      Home Medications    Prior to Admission medications   Medication Sig Start Date End Date Taking? Authorizing Provider  methocarbamol (ROBAXIN) 500 MG tablet Take 1 tablet (500 mg total) by mouth 2 (two) times daily. 08/13/23  Yes Letta Kocher, NP  NON FORMULARY Birth control pill    [provider]    Family History Family History  Problem Relation Age of Onset   Healthy Mother    Healthy Father     Social History Social History   Tobacco Use   Smoking status: Never   Smokeless tobacco: Never  Vaping Use   Vaping status: Never Used  Substance Use Topics   Alcohol use: Yes    Comment: occ   Drug use: No     Allergies   Patient has no known allergies.   Review of Systems Review of Systems  Genitourinary:  Negative for flank pain.  Musculoskeletal:  Positive for back pain, myalgias, neck pain and neck stiffness. Negative for gait problem and joint swelling.  Neurological:  Negative for weakness, light-headedness, numbness and headaches.     Physical Exam Triage Vital Signs ED Triage Vitals [08/13/23 0818]   Encounter Vitals Group     BP 123/88     Systolic BP Percentile      Diastolic BP Percentile      Pulse Rate 69     Resp 16     Temp 98.1 F (36.7 C)     Temp Source Oral     SpO2 96 %     Weight      Height      Head Circumference      Peak Flow      Pain Score 7     Pain Loc      Pain Education      Exclude from Growth Chart    No data found.  Updated Vital Signs BP 123/88 (BP Location: Left Arm)   Pulse 69   Temp 98.1 F (36.7 C) (Oral)   Resp 16   LMP 08/09/2023   SpO2 96%   Visual Acuity Right Eye Distance:   Left Eye Distance:   Bilateral Distance:    Right Eye Near:   Left Eye Near:    Bilateral Near:     Physical Exam Vitals and nursing note reviewed.  Constitutional:      General: She is awake. She is not in acute distress.    Appearance: Normal appearance. She is well-developed and well-groomed. She is not ill-appearing, toxic-appearing or  diaphoretic.  Neck:     Comments: Mild muscular tenderness and movement pain to neck. Musculoskeletal:     Cervical back: Normal range of motion and neck supple. Tenderness present. Pain with movement and muscular tenderness present. Normal range of motion.     Thoracic back: Tenderness present. Normal range of motion.     Comments: Mild tenderness to the right upper thoracic back.  Neurological:     Mental Status: She is alert.     Cranial Nerves: Cranial nerves 2-12 are intact.     Sensory: Sensation is intact.     Motor: Motor function is intact.     Coordination: Coordination is intact.     Gait: Gait is intact.  Psychiatric:        Behavior: Behavior is cooperative.      UC Treatments / Results  Labs (all labs ordered are listed, but only abnormal results are displayed) Labs Reviewed - No data to display  EKG   Radiology No results found.  Procedures Procedures (including critical care time)  Medications Ordered in UC Medications  dexamethasone (DECADRON) injection 10 mg (10 mg  Intramuscular Given 08/13/23 0838)    Initial Impression / Assessment and Plan / UC Course  I have reviewed the triage vital signs and the nursing notes.  Pertinent labs & imaging results that were available during my care of the patient were reviewed by me and considered in my medical decision making (see chart for details).     Patient presented with right upper back pain that radiates to neck x 2 weeks.  Reports worsening pain over the past week.  Upon assessment patient has mild tenderness and movement pain to neck and right upper thoracic back.  Denies numbness and tingling down arms or legs.   Given IM Decadron in clinic to decrease inflammation.  Prescribed Robaxin as needed for back pain and spasms.  Follow-up and return precautions discussed. Final Clinical Impressions(s) / UC Diagnoses   Final diagnoses:  Muscle strain of right upper back, subsequent encounter     Discharge Instructions      You can take Robaxin twice daily as needed for back pain and spasms.  This can make you drowsy so do not work or drive while taking.  Otherwise alternate between ibuprofen and Tylenol every 4-6 hours for pain.  You can also apply heat or ice to help with inflammation causing her pain.  I have attached to [facility] who you can follow-up with if pain persists. Return here as needed.    ED Prescriptions     Medication Sig Dispense Auth. Provider   methocarbamol (ROBAXIN) 500 MG tablet Take 1 tablet (500 mg total) by mouth 2 (two) times daily. 20 tablet Wynonia Lawman A, NP      PDMP not reviewed this encounter.   Wynonia Lawman A, NP 08/13/23 [phone]

## 2023-08-13 NOTE — ED Notes (Signed)
Pt ambulated out for discharge at this time. Respirations regular/unlabored/even, gait is steady, Aox4. Appears to be in no distress at this time.

## 2024-06-20 ENCOUNTER — Other Ambulatory Visit: Payer: Self-pay | Admitting: Obstetrics

## 2024-06-20 ENCOUNTER — Encounter (HOSPITAL_COMMUNITY): Payer: Self-pay | Admitting: Obstetrics

## 2024-06-20 NOTE — Progress Notes (Signed)
 Spoke w/ via phone for pre-op interview--- Mariah Barnett needs dos----   T&S and CBC per surgeon.      Barnett results------ COVID test -----patient states asymptomatic no test needed Arrive at -------1030 NPO after MN NO Solid Food.  Clear liquids from MN until---0930 Pre-Surgery Ensure or G2:  Med rec completed Medications to take morning of surgery -----Zofran - PRN Diabetic medication -----  GLP1 agonist last dose: GLP1 instructions:  Patient instructed no nail polish to be worn day of surgery Patient instructed to bring photo id and insurance card day of surgery Patient aware to have Driver (ride ) / caregiver    for 24 hours after surgery - Boyfriend Mariah Barnett Patient Special Instructions ----- Pre-Op special Instructions -----  Patient verbalized understanding of instructions that were given at this phone interview. Patient denies chest pain, sob, fever, cough at the interview.

## 2024-06-21 ENCOUNTER — Encounter (HOSPITAL_COMMUNITY)
Admission: RE | Disposition: A | Discharge status: Home / Self Care | Payer: Self-pay | Source: Home / Self Care | Attending: Obstetrics

## 2024-06-21 ENCOUNTER — Observation Stay (HOSPITAL_COMMUNITY)
Admission: RE | Admit: 2024-06-21 | Discharge: 2024-06-21 | Disposition: A | Discharge status: Home / Self Care | Attending: Obstetrics | Admitting: Obstetrics

## 2024-06-21 ENCOUNTER — Encounter (HOSPITAL_COMMUNITY): Payer: Self-pay | Admitting: Obstetrics

## 2024-06-21 ENCOUNTER — Ambulatory Visit (HOSPITAL_COMMUNITY): Payer: Self-pay | Admitting: Certified Registered Nurse Anesthetist

## 2024-06-21 ENCOUNTER — Ambulatory Visit (HOSPITAL_COMMUNITY)

## 2024-06-21 ENCOUNTER — Other Ambulatory Visit: Payer: Self-pay

## 2024-06-21 DIAGNOSIS — F1090 Alcohol use, unspecified, uncomplicated: Secondary | ICD-10-CM | POA: Insufficient documentation

## 2024-06-21 DIAGNOSIS — N99820 Postprocedural hemorrhage and hematoma of a genitourinary system organ or structure following a genitourinary system procedure: Principal | ICD-10-CM | POA: Diagnosis present

## 2024-06-21 DIAGNOSIS — O021 Missed abortion: Principal | ICD-10-CM | POA: Insufficient documentation

## 2024-06-21 HISTORY — PX: DILATION AND EVACUATION: SHX1459

## 2024-06-21 HISTORY — DX: Other specified health status: Z78.9

## 2024-06-21 LAB — TYPE AND SCREEN
ABO/RH(D): B POS
Antibody Screen: NEGATIVE

## 2024-06-21 LAB — ABO/RH: ABO/RH(D): B POS

## 2024-06-21 LAB — CBC
HCT: 37.6 % (ref 36.0–46.0)
HCT: 36 % (ref 36.0–46.0)
Hemoglobin: 12.5 g/dL (ref 12.0–15.0)
Hemoglobin: 11.9 g/dL — ABNORMAL LOW (ref 12.0–15.0)
MCH: 30.7 pg (ref 26.0–34.0)
MCH: 30.4 pg (ref 26.0–34.0)
MCHC: 33.2 g/dL (ref 30.0–36.0)
MCHC: 33.1 g/dL (ref 30.0–36.0)
MCV: 92.4 fL (ref 80.0–100.0)
MCV: 91.8 fL (ref 80.0–100.0)
Platelets: 196 K/uL (ref 150–400)
Platelets: 190 K/uL (ref 150–400)
RBC: 4.07 MIL/uL (ref 3.87–5.11)
RBC: 3.92 MIL/uL (ref 3.87–5.11)
RDW: 13.3 % (ref 11.5–15.5)
RDW: 13.2 % (ref 11.5–15.5)
WBC: 7.5 K/uL (ref 4.0–10.5)
WBC: 12.5 K/uL — ABNORMAL HIGH (ref 4.0–10.5)
nRBC: 0 % (ref 0.0–0.2)
nRBC: 0 % (ref 0.0–0.2)

## 2024-06-21 SURGERY — DILATION AND EVACUATION, UTERUS
Anesthesia: General | Site: Uterus

## 2024-06-21 MED ORDER — FENTANYL CITRATE (PF) 250 MCG/5ML IJ SOLN
INTRAMUSCULAR | Status: AC
  Filled 2024-06-21: qty 5

## 2024-06-21 MED ORDER — PROPOFOL 10 MG/ML IV BOLUS
INTRAVENOUS | Status: AC
  Filled 2024-06-21: qty 20

## 2024-06-21 MED ORDER — TRANEXAMIC ACID-NACL 1000-0.7 MG/100ML-% IV SOLN
INTRAVENOUS | Status: DC | PRN
  Administered 2024-06-21: 1000 mg via INTRAVENOUS

## 2024-06-21 MED ORDER — LIDOCAINE-EPINEPHRINE 1 %-1:100000 IJ SOLN
INTRAMUSCULAR | Status: AC
  Filled 2024-06-21: qty 1

## 2024-06-21 MED ORDER — AMISULPRIDE (ANTIEMETIC) 5 MG/2ML IV SOLN: 10.0000 mg | Freq: Once | INTRAVENOUS | Status: DC | PRN

## 2024-06-21 MED ORDER — METHYLERGONOVINE MALEATE 0.2 MG/ML IJ SOLN
INTRAMUSCULAR | Status: AC
Start: 2024-06-21 — End: 2024-06-21
  Filled 2024-06-21: qty 1

## 2024-06-21 MED ORDER — ORAL CARE MOUTH RINSE: 15.0000 mL | Freq: Once | OROMUCOSAL | Status: AC

## 2024-06-21 MED ORDER — PANTOPRAZOLE SODIUM 40 MG PO TBEC
40.0000 mg | DELAYED_RELEASE_TABLET | Freq: Every day | ORAL | Status: DC
  Administered 2024-06-21: 40 mg via ORAL
  Filled 2024-06-21: qty 1

## 2024-06-21 MED ORDER — OXYCODONE HCL 5 MG PO TABS: 5.0000 mg | ORAL_TABLET | ORAL | Status: DC | PRN

## 2024-06-21 MED ORDER — SUCCINYLCHOLINE CHLORIDE 200 MG/10ML IV SOSY
PREFILLED_SYRINGE | INTRAVENOUS | Status: AC
  Filled 2024-06-21: qty 10

## 2024-06-21 MED ORDER — POVIDONE-IODINE 10 % EX SWAB: 2.0000 | Freq: Once | CUTANEOUS | Status: DC

## 2024-06-21 MED ORDER — METHYLERGONOVINE MALEATE 0.2 MG/ML IJ SOLN
INTRAMUSCULAR | Status: DC | PRN
  Administered 2024-06-21: .2 mg via INTRAMUSCULAR

## 2024-06-21 MED ORDER — LACTATED RINGERS IV SOLN: INTRAVENOUS | Status: DC

## 2024-06-21 MED ORDER — DEXAMETHASONE SODIUM PHOSPHATE 10 MG/ML IJ SOLN
INTRAMUSCULAR | Status: DC | PRN
  Administered 2024-06-21: 10 mg via INTRAVENOUS

## 2024-06-21 MED ORDER — FENTANYL CITRATE (PF) 250 MCG/5ML IJ SOLN
INTRAMUSCULAR | Status: DC | PRN
  Administered 2024-06-21 (×5): 50 ug via INTRAVENOUS

## 2024-06-21 MED ORDER — IBUPROFEN 600 MG PO TABS: 600.0000 mg | ORAL_TABLET | Freq: Four times a day (QID) | ORAL | 1 refills | Status: DC | PRN

## 2024-06-21 MED ORDER — ONDANSETRON HCL 4 MG/2ML IJ SOLN: 4.0000 mg | Freq: Four times a day (QID) | INTRAMUSCULAR | Status: DC | PRN

## 2024-06-21 MED ORDER — SUCCINYLCHOLINE CHLORIDE 200 MG/10ML IV SOSY
PREFILLED_SYRINGE | INTRAVENOUS | Status: DC | PRN
  Administered 2024-06-21: 100 mg via INTRAVENOUS

## 2024-06-21 MED ORDER — OXYCODONE-ACETAMINOPHEN 5-325 MG PO TABS: 2.0000 | ORAL_TABLET | Freq: Four times a day (QID) | ORAL | 0 refills | Status: DC | PRN

## 2024-06-21 MED ORDER — PROPOFOL 10 MG/ML IV BOLUS
INTRAVENOUS | Status: DC | PRN
  Administered 2024-06-21: 170 mg via INTRAVENOUS

## 2024-06-21 MED ORDER — DEXAMETHASONE SODIUM PHOSPHATE 10 MG/ML IJ SOLN
INTRAMUSCULAR | Status: AC
Start: 2024-06-21 — End: 2024-06-21
  Filled 2024-06-21: qty 1

## 2024-06-21 MED ORDER — TRANEXAMIC ACID-NACL 1000-0.7 MG/100ML-% IV SOLN
INTRAVENOUS | Status: AC
Start: 2024-06-21 — End: 2024-06-21
  Filled 2024-06-21: qty 100

## 2024-06-21 MED ORDER — ONDANSETRON HCL 4 MG PO TABS: 4.0000 mg | ORAL_TABLET | Freq: Four times a day (QID) | ORAL | Status: DC | PRN

## 2024-06-21 MED ORDER — LIDOCAINE-EPINEPHRINE (PF) 1 %-1:200000 IJ SOLN
INTRAMUSCULAR | Status: DC | PRN
  Administered 2024-06-21: 10 mL

## 2024-06-21 MED ORDER — MIDAZOLAM HCL 2 MG/2ML IJ SOLN
INTRAMUSCULAR | Status: AC
  Filled 2024-06-21: qty 2

## 2024-06-21 MED ORDER — ACETAMINOPHEN 500 MG PO TABS
ORAL_TABLET | ORAL | Status: AC
Start: 2024-06-21 — End: 2024-06-21
  Filled 2024-06-21: qty 2

## 2024-06-21 MED ORDER — METHYLERGONOVINE MALEATE 0.2 MG PO TABS
0.2000 mg | ORAL_TABLET | Freq: Once | ORAL | Status: AC
  Administered 2024-06-21: 0.2 mg via ORAL
  Filled 2024-06-21: qty 1

## 2024-06-21 MED ORDER — LIDOCAINE 2% (20 MG/ML) 5 ML SYRINGE
INTRAMUSCULAR | Status: DC | PRN
  Administered 2024-06-21: 80 mg via INTRAVENOUS

## 2024-06-21 MED ORDER — ONDANSETRON HCL 4 MG/2ML IJ SOLN: 4.0000 mg | Freq: Once | INTRAMUSCULAR | Status: DC | PRN

## 2024-06-21 MED ORDER — CARBOPROST TROMETHAMINE 250 MCG/ML IM SOLN
INTRAMUSCULAR | Status: AC
Start: 2024-06-21 — End: 2024-06-21
  Filled 2024-06-21: qty 1

## 2024-06-21 MED ORDER — MISOPROSTOL 100 MCG PO TABS
ORAL_TABLET | ORAL | Status: DC | PRN
  Administered 2024-06-21: 800 ug

## 2024-06-21 MED ORDER — MENTHOL 3 MG MT LOZG: 1.0000 | LOZENGE | OROMUCOSAL | Status: DC | PRN

## 2024-06-21 MED ORDER — SODIUM CHLORIDE 0.9 % IV SOLN: INTRAVENOUS | Status: DC

## 2024-06-21 MED ORDER — MIDAZOLAM HCL 2 MG/2ML IJ SOLN
INTRAMUSCULAR | Status: DC | PRN
  Administered 2024-06-21: 2 mg via INTRAVENOUS

## 2024-06-21 MED ORDER — ACETAMINOPHEN 500 MG PO TABS
1000.0000 mg | ORAL_TABLET | Freq: Once | ORAL | Status: AC
  Administered 2024-06-21: 1000 mg via ORAL

## 2024-06-21 MED ORDER — ALBUMIN HUMAN 5 % IV SOLN: INTRAVENOUS | Status: DC | PRN

## 2024-06-21 MED ORDER — SIMETHICONE 80 MG PO CHEW
80.0000 mg | CHEWABLE_TABLET | Freq: Four times a day (QID) | ORAL | Status: DC | PRN
Start: 2024-06-21 — End: 2024-06-22

## 2024-06-21 MED ORDER — MISOPROSTOL 200 MCG PO TABS
ORAL_TABLET | ORAL | Status: AC
Start: 2024-06-21 — End: 2024-06-21
  Filled 2024-06-21: qty 1

## 2024-06-21 MED ORDER — CHLORHEXIDINE GLUCONATE 0.12 % MT SOLN
15.0000 mL | Freq: Once | OROMUCOSAL | Status: AC
  Administered 2024-06-21: 15 mL via OROMUCOSAL

## 2024-06-21 MED ORDER — METHYLERGONOVINE MALEATE 0.2 MG PO TABS: 0.2000 mg | ORAL_TABLET | Freq: Three times a day (TID) | ORAL | 0 refills | Status: DC

## 2024-06-21 MED ORDER — ONDANSETRON HCL 4 MG/2ML IJ SOLN
INTRAMUSCULAR | Status: AC
Start: 2024-06-21 — End: 2024-06-21
  Filled 2024-06-21: qty 2

## 2024-06-21 MED ORDER — SODIUM CHLORIDE 0.9 % IV SOLN
100.0000 mg | Freq: Once | INTRAVENOUS | Status: AC
  Administered 2024-06-21: 100 mg via INTRAVENOUS
  Filled 2024-06-21: qty 100

## 2024-06-21 MED ORDER — LIDOCAINE 2% (20 MG/ML) 5 ML SYRINGE
INTRAMUSCULAR | Status: AC
  Filled 2024-06-21: qty 5

## 2024-06-21 MED ORDER — FENTANYL CITRATE (PF) 100 MCG/2ML IJ SOLN: 25.0000 ug | INTRAMUSCULAR | Status: DC | PRN

## 2024-06-21 MED ORDER — ACETAMINOPHEN 500 MG PO TABS
1000.0000 mg | ORAL_TABLET | Freq: Four times a day (QID) | ORAL | Status: DC
  Administered 2024-06-21: 1000 mg via ORAL
  Filled 2024-06-21: qty 2

## 2024-06-21 MED ORDER — KETOROLAC TROMETHAMINE 30 MG/ML IJ SOLN
INTRAMUSCULAR | Status: AC
Start: 2024-06-21 — End: 2024-06-21
  Filled 2024-06-21: qty 1

## 2024-06-21 MED ORDER — ONDANSETRON HCL 4 MG/2ML IJ SOLN
INTRAMUSCULAR | Status: DC | PRN
  Administered 2024-06-21: 4 mg via INTRAVENOUS

## 2024-06-21 MED ORDER — CHLORHEXIDINE GLUCONATE 0.12 % MT SOLN
OROMUCOSAL | Status: AC
  Filled 2024-06-21: qty 15

## 2024-06-21 SURGICAL SUPPLY — 25 items
BAG URINE DRAIN 2000ML AR STRL (UROLOGICAL SUPPLIES) IMPLANT
CATH FOLEY 2WAY SLVR 18FR 30CC (CATHETERS) IMPLANT
CATH ROBINSON RED A/P 16FR (CATHETERS) ×1 IMPLANT
COVER MAYO STAND STRL (DRAPES) ×1 IMPLANT
FILTER UTR ASPR ASSEMBLY (MISCELLANEOUS) ×1 IMPLANT
GLOVE BIO SURGEON STRL SZ 6.5 (GLOVE) ×1 IMPLANT
GLOVE BIOGEL PI IND STRL 7.0 (GLOVE) ×2 IMPLANT
GOWN STRL REUS W/ TWL LRG LVL3 (GOWN DISPOSABLE) ×2 IMPLANT
HOSE CONNECTING 18IN BERKELEY (TUBING) ×1 IMPLANT
KIT BERKELEY 1ST TRI 3/8 NO TR (MISCELLANEOUS) ×1 IMPLANT
KIT BERKELEY 1ST TRIMESTER 3/8 (MISCELLANEOUS) ×1 IMPLANT
NS IRRIG 1000ML POUR BTL (IV SOLUTION) ×1 IMPLANT
PACK VAGINAL MINOR WOMEN LF (CUSTOM PROCEDURE TRAY) ×1 IMPLANT
PAD OB MATERNITY 11 LF (PERSONAL CARE ITEMS) ×1 IMPLANT
SET BERKELEY SUCTION TUBING (SUCTIONS) ×1 IMPLANT
SPIKE FLUID TRANSFER (MISCELLANEOUS) ×1 IMPLANT
SYR 30ML LL (SYRINGE) IMPLANT
TOWEL GREEN STERILE FF (TOWEL DISPOSABLE) ×2 IMPLANT
UNDERPAD 30X36 HEAVY ABSORB (UNDERPADS AND DIAPERS) ×1 IMPLANT
VACURETTE 10 RIGID CVD (CANNULA) IMPLANT
VACURETTE 12 RIGID CVD (CANNULA) IMPLANT
VACURETTE 14MM CVD 1/2 BASE (CANNULA) ×1 IMPLANT
VACURETTE 7MM CVD STRL WRAP (CANNULA) IMPLANT
VACURETTE 8 RIGID CVD (CANNULA) IMPLANT
VACURETTE 9 RIGID CVD (CANNULA) IMPLANT

## 2024-06-21 NOTE — Brief Op Note (Signed)
 06/21/2024  1:46 PM  PATIENT:  Mariah Barnett  28 y.o. female  PRE-OPERATIVE DIAGNOSIS:  Missed abortion at 12 weeks  POST-OPERATIVE DIAGNOSIS:  Missed abortion at 12 weeks  PROCEDURE:  Procedure(s) with comments: DILATION AND EVACUATION, UTERUS (N/A) - 14 week 5 day missed abortion Ultrasound-guided dilation and evacuation of a 12-week missed abortion. Management of acute postpartum hemorrhage Placement of intrauterine tamponade balloon  SURGEON:  Surgeons and Role:    DEWAINE Kandyce Sor, MD - Primary  PHYSICIAN ASSISTANT: None  ASSISTANTS: none   ANESTHESIA:   local and general  EBL:  800 mL   BLOOD ADMINISTERED:none  DRAINS: Foley catheter inserted into the uterus.  LOCAL MEDICATIONS USED:  LIDOCAINE , 1% lidocaine  with 1 200,000 units of epinephrine , about 20 cc used  SPECIMEN:  Source of Specimen:  Products of conception  DISPOSITION OF SPECIMEN:  To pathology and some specimen sent for Anora chromosomal testing  COUNTS:  YES   DICTATION: .Note written in EPIC  PLAN OF CARE: Admit for extended observation  PATIENT DISPOSITION:  PACU - hemodynamically stable.   Delay start of Pharmacological VTE agent (>24hrs) due to surgical blood loss or risk of bleeding: yes

## 2024-06-21 NOTE — Plan of Care (Signed)
   Problem: Education: Goal: Knowledge of General Education information will improve Description: Including pain rating scale, medication(s)/side effects and non-pharmacologic comfort measures Outcome: Progressing   Problem: Coping: Goal: Level of anxiety will decrease Outcome: Progressing

## 2024-06-21 NOTE — H&P (Signed)
 HPI: This is a 28 y.o.G1 at 15 wks by early u/s presented to the office earlier this week and found to have missed AB. No bleeding or pain but u/s showed 12 wks CRL with no FHT.   Pt took cytotec  as instructed. Some cramping, no bleeding.  Past Medical History:  Diagnosis Date   Medical history non-contributory    Past Surgical History:  Procedure Laterality Date   TONSILLECTOMY     Medications Prior to Admission  Medication Sig Dispense Refill Last Dose/Taking   ondansetron  (ZOFRAN ) 4 MG tablet Take 4 mg by mouth every 8 (eight) hours as needed for nausea or vomiting.   06/21/2024 at  9:50 AM   NON FORMULARY Birth control pill (Patient not taking: Reported on 06/20/2024)   More than a month   No Known Allergies Family History  Problem Relation Age of Onset   Healthy Mother    Healthy Father    Social History   Socioeconomic History   Marital status: Significant Other    Spouse name: Not on file   Number of children: Not on file   Years of education: Not on file   Highest education level: Not on file  Occupational History   Not on file  Tobacco Use   Smoking status: Never   Smokeless tobacco: Never  Vaping Use   Vaping status: Never Used  Substance and Sexual Activity   Alcohol use: Yes    Comment: occ   Drug use: No   Sexual activity: Yes  Other Topics Concern   Not on file  Social History Narrative   Not on file   Social Drivers of Health   Financial Resource Strain: Not on file  Food Insecurity: Not on file  Transportation Needs: Not on file  Physical Activity: Not on file  Stress: Not on file  Social Connections: Not on file  Intimate Partner Violence: Not on file     ROS: crampy  PE: Vitals:   06/20/24 0918 06/21/24 1048  BP:  117/68  Pulse:  93  Resp:  16  Temp:  98.4 F (36.9 C)  TempSrc:  Oral  SpO2:  95%  Weight: 93.9 kg 93.9 kg  Height: 5' 7 (1.702 m) 5' 7 (1.702 m)    Gen: well appearing, no distress Abdomen: non-tender,  non-distended GU: deferred to OR LE: non-tender, no edema  CBC    Component Value Date/Time   WBC 7.5 06/21/2024 1109   RBC 4.07 06/21/2024 1109   HGB 12.5 06/21/2024 1109   HCT 37.6 06/21/2024 1109   PLT 196 06/21/2024 1109   MCV 92.4 06/21/2024 1109   MCH 30.7 06/21/2024 1109   MCHC 33.2 06/21/2024 1109   RDW 13.3 06/21/2024 1109     A/P: This is a 27 yo G1 with 12 wk missed Ab for u/s guided D&C.  R/B of surgery including anaesthetic risks, risks of bleeding, infection, trauma to surrounding organs. Pt agrees to proceed.   Plan Mariah Barnett 06/21/2024 11:32 AM

## 2024-06-21 NOTE — Anesthesia Procedure Notes (Signed)
 Procedure Name: Intubation Date/Time: 06/21/2024 12:31 PM  Performed by: Harrold Macintosh, CRNAPre-anesthesia Checklist: Patient identified, Emergency Drugs available, Suction available and Patient being monitored Patient Re-evaluated:Patient Re-evaluated prior to induction Oxygen Delivery Method: Circle system utilized Preoxygenation: Pre-oxygenation with 100% oxygen Induction Type: IV induction, Rapid sequence and Cricoid Pressure applied Laryngoscope Size: Miller and 2 Grade View: Grade I Tube type: Oral Tube size: 6.5 mm Number of attempts: 1 Airway Equipment and Method: Stylet and Bite block Placement Confirmation: ETT inserted through vocal cords under direct vision, positive ETCO2 and breath sounds checked- equal and bilateral Secured at: 21 cm Tube secured with: Tape Dental Injury: Teeth and Oropharynx as per pre-operative assessment

## 2024-06-21 NOTE — Transfer of Care (Signed)
 mmediate Anesthesia Transfer of Care Note  Patient: Mariah Barnett  Procedure(s) Performed: DILATION AND EVACUATION, UTERUS (Vagina )  Patient Location: PACU  Anesthesia Type:General  Level of Consciousness: awake, alert , and oriented  Airway & Oxygen Therapy: Patient Spontanous Breathing and Patient connected to nasal cannula oxygen  Post-op Assessment: Report given to RN, Post -op Vital signs reviewed and stable, Patient moving all extremities X 4, and Patient able to stick tongue midline  Post vital signs: Reviewed and stable  Last Vitals:  Vitals Value Taken Time  BP 101/47 06/21/24 13:47  Temp 97.4   Pulse 86 06/21/24 13:49  Resp 17 06/21/24 13:50  SpO2 100 % 06/21/24 13:49  Vitals shown include unfiled device data.  Last Pain:  Vitals:   06/21/24 1048  TempSrc: Oral  PainSc: 2       Patients Stated Pain Goal: 2 (06/21/24 1048)  Complications: No notable events documented.

## 2024-06-21 NOTE — Anesthesia Preprocedure Evaluation (Signed)
 Anesthesia Evaluation  Patient identified by MRN, date of birth, ID band Patient awake    Reviewed: Allergy & Precautions, NPO status , Patient's Chart, lab work & pertinent test results  Airway Mallampati: II  TM Distance: >3 FB Neck ROM: Full    Dental  (+) Teeth Intact, Dental Advisory Given   Pulmonary neg pulmonary ROS   Pulmonary exam normal breath sounds clear to auscultation       Cardiovascular negative cardio ROS Normal cardiovascular exam Rhythm:Regular Rate:Normal     Neuro/Psych negative neurological ROS     GI/Hepatic negative GI ROS, Neg liver ROS,,,  Endo/Other  Obesity   Renal/GU negative Renal ROS     Musculoskeletal negative musculoskeletal ROS (+)    Abdominal   Peds  Hematology negative hematology ROS (+)   Anesthesia Other Findings Day of surgery medications reviewed with the patient.  Reproductive/Obstetrics Missed abortion Should be 14 wks by early u/s                              Anesthesia Physical Anesthesia Plan  ASA: 2  Anesthesia Plan: General   Post-op Pain Management: Tylenol  PO (pre-op)* and Toradol  IV (intra-op)*   Induction: Intravenous  PONV Risk Score and Plan: 4 or greater and Midazolam , Ondansetron  and Dexamethasone   Airway Management Planned: Oral ETT  Additional Equipment:   Intra-op Plan:   Post-operative Plan: Extubation in OR  Informed Consent: I have reviewed the patients History and Physical, chart, labs and discussed the procedure including the risks, benefits and alternatives for the proposed anesthesia with the patient or authorized representative who has indicated his/her understanding and acceptance.     Dental advisory given  Plan Discussed with: CRNA  Anesthesia Plan Comments:          Anesthesia Quick Evaluation

## 2024-06-21 NOTE — Anesthesia Postprocedure Evaluation (Signed)
 Anesthesia Post Note  Patient: Mariah Barnett  Procedure(s) Performed: ULTRASOUND GUIDED DILATION AND EVACUATION OF 12-WEEK MISSED ABORTION, UTERUS (Uterus)     Patient location during evaluation: PACU Anesthesia Type: General Level of consciousness: awake and alert Pain management: pain level controlled Vital Signs Assessment: post-procedure vital signs reviewed and stable Respiratory status: spontaneous breathing, nonlabored ventilation, respiratory function stable and patient connected to nasal cannula oxygen Cardiovascular status: blood pressure returned to baseline and stable Postop Assessment: no apparent nausea or vomiting Anesthetic complications: no   No notable events documented.  Last Vitals:  Vitals:   06/21/24 1530 06/21/24 1551  BP: 113/71 113/76  Pulse: 82 96  Resp: (!) 21 16  Temp:  (!) 36.4 C  SpO2: 100% 98%    Last Pain:  Vitals:   06/21/24 1551  TempSrc: Oral  PainSc:                  [name] Cornea

## 2024-06-21 NOTE — Op Note (Signed)
 06/21/2024  1:46 PM  PATIENT:  Mariah Barnett  28 y.o. female  PRE-OPERATIVE DIAGNOSIS:  Missed abortion at 12 weeks  POST-OPERATIVE DIAGNOSIS:  Missed abortion at 12 weeks  PROCEDURE:  Procedure(s) with comments: DILATION AND EVACUATION, UTERUS (N/A) - 14 week 5 day missed abortion Ultrasound-guided dilation and evacuation of a 12-week missed abortion. Management of acute postpartum hemorrhage Placement of intrauterine tamponade balloon  SURGEON:  Surgeons and Role:    DEWAINE Kandyce Sor, MD - Primary  PHYSICIAN ASSISTANT: None  ASSISTANTS: none   ANESTHESIA:   local and general  EBL:  800 mL   BLOOD ADMINISTERED:none  DRAINS: Foley catheter inserted into the uterus.  LOCAL MEDICATIONS USED:  LIDOCAINE , 1% lidocaine  with 1 200,000 units of epinephrine , about 20 cc used  SPECIMEN:  Source of Specimen:  Products of conception  DISPOSITION OF SPECIMEN:  To pathology and some specimen sent for Anora chromosomal testing  COUNTS:  YES   DICTATION: .Note written in EPIC  PLAN OF CARE: Admit for extended observation  PATIENT DISPOSITION:  PACU - hemodynamically stable.   Delay start of Pharmacological VTE agent (>24hrs) due to surgical blood loss or risk of bleeding: yes  Antibiotic: 100 mg of IV doxycycline    Findings: 12 weeks size uterus preprocedure, decreased uterine size post procedure with postprocedural bogginess after more than typical blood loss.  thin endometrial stripe at 0.7 cm with no flow suggestive retained products of conception though images were suboptimal  Complications: Procedural hemorrhage  Indications: A 28 year old G1 who presented for 12-week dilation and evacuation after exam at 15 weeks by dates showed a 12-week missed miscarriage with no fetal heart tones.. Patient was given options of expectant management vs  cytotec  versus surgical evacuation and chose the latter. Risks benefits were discussed with patient who agreed to proceed.    Procedure: After informed was obtained the patient was taken to the operating room where general anesthesia was initiated without difficulty. She was prepped and draped in the normal fashion in dorsal supine lithotomy position.  No bladder drainage was done due  to the desire for a full bladder for improved ultrasound guidance. A bimanual examination was performed to assess the size and the position of the uterus. A sterile speculum was placed into the vagina.  20 cc of 1% lidocaine  were placed at 5 and 7:00 of the cervical paracervical junction. A single-tooth tenaculum was used to grasp the anterior lip of the cervix.  The cervix was serially dilated with Fredirick dilators to a # 37.  Dilation was smooth and without resistance and under ultrasound guidance.  A 12 French suction curet was advanced past the internal os under ultrasound guidance. Suction curettage was carried out.  Initial suction did not remove the products of conception but placenta material was seen at the cervical os.  Using a ring forcep placental tissue was removed from the cervical os.  After this,  under ultrasound guidance a 12 French suction curette was introduced into the uterus with removal of products of conception.  Ultrasound guidance was suboptimal due to position of the bowel.  Once I thought all products of conception's were removed . a sharp curette was used to gently palpate the walls of the uterus and a gritty cry was noted.  An 8 French suction curette was then introduced to remove any additional blood products.  No additional return of blood or tissue was noted.  The abdominal ultrasound remained suboptimal for evaluation of the endometrial stripe.  I asked for transvaginal ultrasound to better identify the endometrial stripe but this was not available and took about 15 minutes to locate and bring a transvaginal probe into the OR. Pregnancy tissue was gone, no sac was remaining and no aggressive blood flow was noted by  ultrasound to the endometrium.  However patient was bleeding aggressively.  IV bolus and albumin  was given.  TXA and Methergine  were given.  800 mcg of rectal Cytotec  was placed.  Bimanual massage was carried out.  Eventually hemostasis was adequate.  The transvaginal ultrasound then showed a boggy heterogeneous myometrium with what appeared to be a normal endometrial stripe.  To help better identify the endometrial stripe an abdominal ultrasound was again done and a sound was gently introduced into the endometrium but did not extend to the fundus.  At this time aggressive bleeding began again.  A Foley catheter was inserted into the uterus and the balloon was inflated to 30 cc.  This was done under ultrasound guidance.  About 3 minutes later we repeated ultrasound to ensure no blood was collecting above the Foley.  At this point hemostasis was assured with a total EBL 800 cc.  The procedure was terminated and the decision was made to admit the patient for observation  Patient tolerated the procedure well sponge lap and needle count were correct x3. Patient seems recovery room in stable condition.  [name] A. 09/17/2012 10:53 AM

## 2024-06-21 NOTE — Discharge Summary (Signed)
 Mariah Barnett MRN: [id number] DOB/AGE: 31-Mar-1996 30 y.o.  Admit date: 06/21/2024 Discharge date: 06/21/24  Admission Diagnoses: Missed abortion [O02.1] Postprocedural hemorrhage of a genitourinary system organ or structure following a genitourinary system procedure [N99.820]  Discharge Diagnoses: Missed abortion [O02.1] Postprocedural hemorrhage of a genitourinary system organ or structure following a genitourinary system procedure [N99.820]        Principal Problem:   Postprocedural hemorrhage of a genitourinary system organ or structure following a genitourinary system procedure   Discharged Condition: good  Hospital Course: Admitted for planned procedure. Excessive uterine bleeding following D&E at 12 wks for MAB. Bleeding managed by 800 mcg rectal cytotec , methergine , uterine massage and intrauterine foley balloon. U/s guidance was used during the procedure and to monitor bleeding. Pt was given IVF and albumin . Pt was admitted for obeservation. CBC 6 hrs post-procedure was stable, pt was feeling well, able to tolerate food, ambulated to void w/o dizziness and have pain controlled by oral medications. The uterine foley balloon was removed and bleeding was minimal and decision was made to d/c pt home. Pt is to take OTC iron qoday and f/u in office for 2 wks post-op visit.   Consults: None  Treatments: surgery: u/s guided D&E  Disposition: Discharge disposition: 01-Home or Self Care        Allergies as of 06/21/2024   No Known Allergies      Medication List     STOP taking these medications    ondansetron  4 MG tablet Commonly known as: ZOFRAN        TAKE these medications    ibuprofen  600 MG tablet Commonly known as: ADVIL  Take 1 tablet (600 mg total) by mouth every 6 (six) hours as needed.   methylergonovine 0.2 MG tablet Commonly known as: Methergine  Take 1 tablet (0.2 mg total) by mouth every 8 (eight) hours.   oxyCODONE -acetaminophen  5-325 MG  tablet Commonly known as: Percocet Take 2 tablets by mouth every 6 (six) hours as needed for severe pain (pain score 7-10).         Signed: Burnard DELENA Bowers, MD 06/21/2024, 9:35 PM

## 2024-06-22 ENCOUNTER — Encounter (HOSPITAL_COMMUNITY): Payer: Self-pay | Admitting: Obstetrics

## 2024-06-22 ENCOUNTER — Other Ambulatory Visit (HOSPITAL_COMMUNITY): Payer: Self-pay

## 2024-06-22 LAB — SURGICAL PATHOLOGY

## 2024-06-22 MED ORDER — METHYLERGONOVINE MALEATE 0.2 MG PO TABS
0.2000 mg | ORAL_TABLET | Freq: Three times a day (TID) | ORAL | 0 refills | Status: DC
  Filled 2024-06-22 (×2): qty 5, 2d supply, fill #0

## 2024-06-22 NOTE — Progress Notes (Signed)
 Pt was discharge via wheelchair accompanied by Rolin, (nurse tech) and her husband. Discharge teaching was completed. Pt denied pain and showed no signs of distress.  Both PIVs were removed, with no signs of active bleeding from the insertion site. Pt voided her bladder thirty minutes before discharge, minimal blood was in the toilet. Pt was discharged with the understanding that she would drive the [facility] to fill her prescriptions. VSS on RA.

## 2024-06-28 LAB — ANORA MISCARRIAGE TEST - FRESH

## 2024-07-24 ENCOUNTER — Encounter (HOSPITAL_COMMUNITY): Payer: Self-pay | Admitting: *Deleted

## 2024-07-24 ENCOUNTER — Ambulatory Visit (HOSPITAL_COMMUNITY)
Admission: EM | Admit: 2024-07-24 | Discharge: 2024-07-24 | Disposition: A | Discharge status: Home / Self Care | Attending: Emergency Medicine | Admitting: Emergency Medicine

## 2024-07-24 DIAGNOSIS — R052 Subacute cough: Secondary | ICD-10-CM

## 2024-07-24 MED ORDER — PROMETHAZINE-DM 6.25-15 MG/5ML PO SYRP: 5.0000 mL | ORAL_SOLUTION | Freq: Every evening | ORAL | 0 refills | Status: DC | PRN

## 2024-07-24 MED ORDER — BENZONATATE 100 MG PO CAPS: 100.0000 mg | ORAL_CAPSULE | Freq: Three times a day (TID) | ORAL | 0 refills | Status: DC

## 2024-07-24 NOTE — Discharge Instructions (Addendum)
 You can take Tessalon  every 8 hours as needed for cough. You can take promethazine  DM cough syrup at bedtime as needed for cough.  This can make you drowsy so do not drive, work, or drink alcohol while taking this. Otherwise you can take over-the-counter Mucinex (guaifenesin) as needed for cough. If you develop chest pain, shortness of breath, or persistent fever return here for reevaluation.

## 2024-07-24 NOTE — ED Triage Notes (Signed)
 Pt states she has had a dry cough X 1 month. She has been taking a variety of OTC cough meds without relief.

## 2024-07-24 NOTE — ED Provider Notes (Signed)
 [facility]    CSN: [id number] Arrival date & time: 07/24/24  0800      History   Chief Complaint Chief Complaint  Patient presents with   Cough    HPI Mariah Barnett is a 28 y.o. female.   Patient presents with likely chronic dry cough for about a month.  Patient states that about a month ago she began to have cold-like symptoms which have mostly resolved, but continues to have a cough.    Patient reports that she has been taking Mucinex and TheraFlu without relief. Patient denies chest pain, shortness of breath, fever, body aches, chills, sweats, and weakness.  Patient also denies history of asthma or COPD.  Patient also denies being a smoker.  The history is provided by the patient and medical records.  Cough   Past Medical History:  Diagnosis Date   Medical history non-contributory     Patient Active Problem List   Diagnosis Date Noted   Postprocedural hemorrhage of a genitourinary system organ or structure following a genitourinary system procedure 06/21/2024    Past Surgical History:  Procedure Laterality Date   DILATION AND EVACUATION N/A 06/21/2024   Procedure: ULTRASOUND GUIDED DILATION AND EVACUATION OF 12-WEEK MISSED ABORTION, UTERUS;  Surgeon: Kandyce Sor, MD;  Location: [facility];  Service: Gynecology;  Laterality: N/A;  14 week 5 day missed abortion   TONSILLECTOMY      OB History   No obstetric history on file.      Home Medications    Prior to Admission medications   Medication Sig Start Date End Date Taking? Authorizing Provider  benzonatate  (TESSALON ) 100 MG capsule Take 1 capsule (100 mg total) by mouth every 8 (eight) hours. 07/24/24  Yes Johnie Flaming A, NP  promethazine -dextromethorphan (PROMETHAZINE -DM) 6.25-15 MG/5ML syrup Take 5 mLs by mouth at bedtime as needed for cough. 07/24/24  Yes Johnie, Shandel Busic A, NP  ibuprofen  (ADVIL ) 600 MG tablet Take 1 tablet (600 mg total) by mouth every 6 (six) hours as needed. 06/21/24    Kandyce Sor, MD  methylergonovine (METHERGINE ) 0.2 MG tablet Take 1 tablet (0.2 mg total) by mouth every 8 (eight) hours. 06/21/24   Kandyce Sor, MD  oxyCODONE -acetaminophen  (PERCOCET) 5-325 MG tablet Take 2 tablets by mouth every 6 (six) hours as needed for severe pain (pain score 7-10). 06/21/24   Kandyce Sor, MD    Family History Family History  Problem Relation Age of Onset   Healthy Mother    Healthy Father     Social History Social History   Tobacco Use   Smoking status: Never   Smokeless tobacco: Never  Vaping Use   Vaping status: Never Used  Substance Use Topics   Alcohol use: Yes    Comment: occ   Drug use: No     Allergies   Patient has no known allergies.   Review of Systems Review of Systems  Respiratory:  Positive for cough.    Per HPI  Physical Exam Triage Vital Signs ED Triage Vitals  Encounter Vitals Group     BP 07/24/24 0819 108/68     Girls Systolic BP Percentile --      Girls Diastolic BP Percentile --      Boys Systolic BP Percentile --      Boys Diastolic BP Percentile --      Pulse Rate 07/24/24 0819 61     Resp 07/24/24 0819 16     Temp 07/24/24 0819 98.3 F (36.8 C)  Temp Source 07/24/24 0819 Oral     SpO2 07/24/24 0819 96 %     Weight --      Height --      Head Circumference --      Peak Flow --      Pain Score 07/24/24 0817 0     Pain Loc --      Pain Education --      Exclude from Growth Chart --    No data found.  Updated Vital Signs BP 108/68 (BP Location: Left Arm)   Pulse 61   Temp 98.3 F (36.8 C) (Oral)   Resp 16   SpO2 96%   Visual Acuity Right Eye Distance:   Left Eye Distance:   Bilateral Distance:    Right Eye Near:   Left Eye Near:    Bilateral Near:     Physical Exam Vitals and nursing note reviewed.  Constitutional:      General: She is awake. She is not in acute distress.    Appearance: Normal appearance. She is well-developed and well-groomed. She is not ill-appearing.  HENT:      Right Ear: Tympanic membrane, ear canal and external ear normal.     Left Ear: Tympanic membrane, ear canal and external ear normal.     Nose: Nose normal.     Mouth/Throat:     Mouth: Mucous membranes are moist.     Pharynx: Oropharynx is clear.  Cardiovascular:     Rate and Rhythm: Normal rate and regular rhythm.  Pulmonary:     Effort: Pulmonary effort is normal.     Breath sounds: Normal breath sounds.  Skin:    General: Skin is warm and dry.  Neurological:     Mental Status: She is alert.  Psychiatric:        Behavior: Behavior is cooperative.      UC Treatments / Results  Labs (all labs ordered are listed, but only abnormal results are displayed) Labs Reviewed - No data to display  EKG   Radiology No results found.  Procedures Procedures (including critical care time)  Medications Ordered in UC Medications - No data to display  Initial Impression / Assessment and Plan / UC Course  I have reviewed the triage vital signs and the nursing notes.  Pertinent labs & imaging results that were available during my care of the patient were reviewed by me and considered in my medical decision making (see chart for details).     Patient is overall well-appearing.  Vitals are stable.  No significant findings on exam.  Heart and lung sounds normal.  Prescribed Tessalon  and Promethazine  DM as needed for cough.  Discussed over-the-counter medications as needed for cough.  Discussed follow-up and return precautions. Final Clinical Impressions(s) / UC Diagnoses   Final diagnoses:  Subacute cough     Discharge Instructions      You can take Tessalon  every 8 hours as needed for cough. You can take promethazine  DM cough syrup at bedtime as needed for cough.  This can make you drowsy so do not drive, work, or drink alcohol while taking this. Otherwise you can take over-the-counter Mucinex (guaifenesin) as needed for cough. If you develop chest pain, shortness of breath,  or persistent fever return here for reevaluation.    ED Prescriptions     Medication Sig Dispense Auth. Provider   benzonatate  (TESSALON ) 100 MG capsule Take 1 capsule (100 mg total) by mouth every 8 (eight) hours. 21 capsule  Johnie Flaming A, NP   promethazine -dextromethorphan (PROMETHAZINE -DM) 6.25-15 MG/5ML syrup Take 5 mLs by mouth at bedtime as needed for cough. 118 mL Johnie Flaming A, NP      PDMP not reviewed this encounter.   Johnie Flaming A, [place] 07/24/24 [phone]

## 2024-08-23 ENCOUNTER — Other Ambulatory Visit (HOSPITAL_BASED_OUTPATIENT_CLINIC_OR_DEPARTMENT_OTHER): Payer: Self-pay

## 2024-09-25 ENCOUNTER — Inpatient Hospital Stay: Attending: Nurse Practitioner | Admitting: Nurse Practitioner

## 2024-09-25 ENCOUNTER — Inpatient Hospital Stay

## 2024-09-25 VITALS — BP 110/60 | HR 89 | Temp 98.0°F | Resp 17 | Wt 220.3 lb

## 2024-09-25 DIAGNOSIS — R760 Raised antibody titer: Secondary | ICD-10-CM | POA: Insufficient documentation

## 2024-09-25 DIAGNOSIS — R7689 Other specified abnormal immunological findings in serum: Secondary | ICD-10-CM | POA: Diagnosis not present

## 2024-09-25 NOTE — Assessment & Plan Note (Signed)
 The patient has no significant medical history.  She had miscarriage at approximately [redacted] weeks gestation.  Had gone in for ultrasound at 14 weeks, only faint fetal heart rate was noted.  Ultrasound had shown approximately 7-week old fetus.  On follow-up, the heartbeat was no longer able to be found.  She had to have D&C to evacuate products of conception on 06/21/2024.  She followed up with her OB/GYN provider on September 04, 2024.  Labs were drawn to evaluate for possible causes of miscarriage.   a full hypercoagulable workup was performed.  This of note, anticardiolipin IgM was elevated at 36.  Anticardiolipin IgG was normal at 11.  The remainder of the hypercoagulable panel was normal.  She did have beta-2 IgG and IgM, both with values of <9.  Prothrombin gene analysis was not detected.  Protein S total and protein S free are both within normal limits.  Lupus anticoagulation panel was normal.  PTT-LA was 35.1 and DRVVT was normal at 42.1.  Antithrombin III was normal at 78.  She was negative for factor V Leiden.  The patient denies personal or family history of blood clots that she is aware of.  She is up-to-date with her cervical cancer screening which was negative. We discussed recent labs done through her OB/GYN provider.  She had unremarkable hypercoagulable workup other than anticardiolipin IgM.  This was moderately elevated at 36.  Anticardiolipin IgG was normal at 11.  The remainder of her labs were also normal.  We discussed this single positive test was not diagnostic of antiphospholipid syndrome by itself.  We would need to retest anticardiolipin antibodies, both IgG and IgM in approximately 3 months.  If positive with second lab draw, is more likely related to positive antiphospholipid syndrome.  In this case, she would need anticoagulation during subsequent pregnancies.  This would likely be in the form of Lovenox injections, safest for use during pregnancy.  If repeat testing is negative for  anticardiolipin IgG and IgM, will need to repeat testing in third time after 3 additional months. The patient reports having a mostly sedentary job.  We did discuss benefit of wearing compression socks to help with blood return to the heart.  I also recommended that she get up, stretch her legs, and walk around every few hours while at work and if driving or traveling for long peers of time.  She is agreeable to this.  States she also plans to increase exercise now that energy levels are returning to normal. Plan Repeat cardiolipin antibodies IgG and IgM in early February.  Consider adding ANA to evaluate for autoimmune conditions as well. Phone visit approximately 2 weeks later to review lab results.

## 2024-09-25 NOTE — Progress Notes (Cosign Needed)
 [facility]  Telephone:([phone]) [phone]   HEMATOLOGY ONCOLOGY INPATIENT CONSULTATION   Mariah Barnett  DOB: [birth year]  MR#: [id number]  CSN#: [id number]    Requesting Physician: Devere Brave, MD -[facility]  Patient Care Team: Pcp, No as PCP - General  Reason for consult: Anticardiolipin antibody positive  History of present illness:   The patient has no significant medical history.  She had miscarriage at approximately [redacted] weeks gestation.  Had gone in for ultrasound at 14 weeks, only faint fetal heart rate was noted.  Ultrasound had shown approximately 84-week old fetus.  On follow-up, the heartbeat was no longer able to be found.  She had to have D&C to evacuate products of conception on 06/21/2024.  She followed up with her OB/GYN provider on September 04, 2024.  Labs were drawn to evaluate for possible causes of miscarriage.   a full hypercoagulable workup was performed.  This of note, anticardiolipin IgM was elevated at 36.  Anticardiolipin IgG was normal at 11.  The remainder of the hypercoagulable panel was normal.  She did have beta-2 IgG and IgM, both with values of <9.  Prothrombin gene analysis was not detected.  Protein S total and protein S free are both within normal limits.  Lupus anticoagulation panel was normal.  PTT-LA was 35.1 and DRVVT was normal at 42.1.  Antithrombin III was normal at 78.  She was negative for factor V Leiden.  The patient denies personal or family history of blood clots that she is aware of.  She is up-to-date with her cervical cancer screening which was negative. Today, she presents to hematology by herself.  She states that physically, she is feeling well.  She denies chest pain, chest pressure, or shortness of breath.  She denies leg pain or swelling.  She denies any unusual bleeding or bruising.  She denies abdominal pain, nausea, vomiting, or diarrhea.  She denies fevers or chills, night sweats, or unintentional weight loss.  She denies  headaches, changes in vision, or new weakness. Socially, the patient is engaged to be married in April 2026.  She states her fianc has been very supportive through her miscarriage and follow-up.  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  She has returned to work regular hours.  She denies smoking history.  She drinks only socially.  She denies use of illegal or illicit drugs.  Of note, she states this was her first pregnancy.  Her younger sister is currently pregnant and experiencing a healthy pregnancy.  MEDICAL HISTORY:  Past Medical History:  Diagnosis Date   Medical history non-contributory     SURGICAL HISTORY: Past Surgical History:  Procedure Laterality Date   DILATION AND EVACUATION N/A 06/21/2024   Procedure: ULTRASOUND GUIDED DILATION AND EVACUATION OF 12-WEEK MISSED ABORTION, UTERUS;  Surgeon: Kandyce Sor, MD;  Location: [facility];  Service: Gynecology;  Laterality: N/A;  14 week 5 day missed abortion   TONSILLECTOMY      SOCIAL HISTORY: Social History   Socioeconomic History   Marital status: Significant Other    Spouse name: Not on file   Number of children: Not on file   Years of education: Not on file   Highest education level: Not on file  Occupational History   Not on file  Tobacco Use   Smoking status: Never   Smokeless tobacco: Never  Vaping Use   Vaping status: Never Used  Substance and Sexual Activity   Alcohol use: Yes  Comment: occ   Drug use: No   Sexual activity: Yes  Other Topics Concern   Not on file  Social History Narrative   Not on file   Social Drivers of Health   Financial Resource Strain: Not on file  Food Insecurity: No Food Insecurity (09/25/2024)   Hunger Vital Sign    Worried About Running Out of Food in the Last Year: Never true    Ran Out of Food in the Last Year: Never true  Transportation Needs: No Transportation Needs (09/25/2024)   PRAPARE - Administrator, Civil Service (Medical): No    Lack of  Transportation (Non-Medical): No  Physical Activity: Not on file  Stress: Not on file  Social Connections: Not on file  Intimate Partner Violence: Not At Risk (09/25/2024)   Humiliation, Afraid, Rape, and Kick questionnaire    Fear of Current or Ex-Partner: No    Emotionally Abused: No    Physically Abused: No    Sexually Abused: No    FAMILY HISTORY: Family History  Problem Relation Age of Onset   Healthy Mother    Healthy Father     ALLERGIES:  has no known allergies.  MEDICATIONS:  No current outpatient medications on file.   No current facility-administered medications for this visit.    REVIEW OF SYSTEMS:   Constitutional: Denies fevers, chills or abnormal night sweats Eyes: Denies blurriness of vision, double vision or watery eyes Ears, nose, mouth, throat, and face: Denies mucositis or sore throat Respiratory: Denies cough, dyspnea or wheezes Cardiovascular: Denies palpitation, chest discomfort or lower extremity swelling Gastrointestinal:  Denies nausea, heartburn or change in bowel habits Skin: Denies abnormal skin rashes Lymphatics: Denies new lymphadenopathy or easy bruising Neurological:Denies numbness, tingling or new weaknesses Behavioral/Psych: Mood is stable, no new changes  All other systems were reviewed with the patient and are negative.  PHYSICAL EXAMINATION: ECOG PERFORMANCE STATUS: 0 - Asymptomatic  Vitals:   09/25/24 1402  BP: 110/60  Pulse: 89  Resp: 17  Temp: 98 F (36.7 C)  SpO2: 99%   Filed Weights   09/25/24 1402  Weight: 220 lb 4.8 oz (99.9 kg)    GENERAL:alert, no distress and comfortable SKIN: skin color, texture, turgor are normal, no rashes or significant lesions EYES: normal, conjunctiva are pink and non-injected, sclera clear OROPHARYNX:no exudate, no erythema and lips, buccal mucosa, and tongue normal  NECK: supple, thyroid normal size, non-tender, without nodularity LYMPH:  no palpable lymphadenopathy in the cervical,  axillary or inguinal LUNGS: clear to auscultation and percussion with normal breathing effort HEART: regular rate & rhythm and no murmurs and no lower extremity edema ABDOMEN:abdomen soft, non-tender and normal bowel sounds Musculoskeletal:no cyanosis of digits and no clubbing  PSYCH: alert & oriented x 3 with fluent speech NEURO: no focal motor/sensory deficits  LABORATORY DATA:  I have reviewed the data as listed Lab Results  Component Value Date   WBC 12.5 (H) 06/21/2024   HGB 11.9 (L) 06/21/2024   HCT 36.0 06/21/2024   MCV 91.8 06/21/2024   PLT 190 06/21/2024    Assessment and plan Anticardiolipin antibody positive Assessment & Plan: The patient has no significant medical history.  She had miscarriage at approximately [redacted] weeks gestation.  Had gone in for ultrasound at 14 weeks, only faint fetal heart rate was noted.  Ultrasound had shown approximately 67-week old fetus.  On follow-up, the heartbeat was no longer able to be found.  She had to have D&C to evacuate  products of conception on 06/21/2024.  She followed up with her OB/GYN provider on September 04, 2024.  Labs were drawn to evaluate for possible causes of miscarriage.   a full hypercoagulable workup was performed.  This of note, anticardiolipin IgM was elevated at 36.  Anticardiolipin IgG was normal at 11.  The remainder of the hypercoagulable panel was normal.  She did have beta-2 IgG and IgM, both with values of <9.  Prothrombin gene analysis was not detected.  Protein S total and protein S free are both within normal limits.  Lupus anticoagulation panel was normal.  PTT-LA was 35.1 and DRVVT was normal at 42.1.  Antithrombin III was normal at 78.  She was negative for factor V Leiden.  The patient denies personal or family history of blood clots that she is aware of.  She is up-to-date with her cervical cancer screening which was negative. We discussed recent labs done through her OB/GYN provider.  She had unremarkable  hypercoagulable workup other than anticardiolipin IgM.  This was moderately elevated at 36.  Anticardiolipin IgG was normal at 11.  The remainder of her labs were also normal.  We discussed this single positive test was not diagnostic of antiphospholipid syndrome by itself.  We would need to retest anticardiolipin antibodies, both IgG and IgM in approximately 3 months.  If positive with second lab draw, is more likely related to positive antiphospholipid syndrome.  In this case, she would need anticoagulation during subsequent pregnancies.  This would likely be in the form of Lovenox injections, safest for use during pregnancy.  If repeat testing is negative for anticardiolipin IgG and IgM, will need to repeat testing in third time after 3 additional months. The patient reports having a mostly sedentary job.  We did discuss benefit of wearing compression socks to help with blood return to the heart.  I also recommended that she get up, stretch her legs, and walk around every few hours while at work and if driving or traveling for long peers of time.  She is agreeable to this.  States she also plans to increase exercise now that energy levels are returning to normal. Plan Repeat cardiolipin antibodies IgG and IgM in early February.  Consider adding ANA to evaluate for autoimmune conditions as well. Phone visit approximately 2 weeks later to review lab results.  Orders: -     Cardiolipin antibodies, IgG, IgM, IgA; Future -     ANA w/Reflex; Future     This was a shared visit with Dr. Lanny. All questions were answered. The patient knows to call the clinic with any problems, questions or concerns.      Powell FORBES Lessen, NP 09/25/2024 4:05 PM  Addendum I have seen the patient, examined her. I agree with the assessment and and plan and have edited the notes.   Patient is a 28 year old female without past medical history, except recent pregnancy loss at 14 weeks.  She had hypercoagulopathy workup by  her OB, which showed elevated anticardiolipin antibody IgM at a titer of 36, and IgG was normal.  The rest of the hypercoagulopathy workup was negative.  He had no other history of thrombosis.  I recommend repeating anticardiolipin antibody in 3 months.  She does not meet her criteria for antiphospholipid syndrome at this point.  We discussed the role of anticoagulation with Lovenox injections for future pregnancy if her repeated anticardiolipin titer is above 40 on next lab.  Will plan to call her with lab results in  3 to 4 months.  All questions were answered.  Onita Mattock MD 09/25/2024

## 2024-10-05 ENCOUNTER — Encounter: Payer: Self-pay | Admitting: Obstetrics and Gynecology

## 2024-12-04 ENCOUNTER — Inpatient Hospital Stay

## 2024-12-05 ENCOUNTER — Other Ambulatory Visit: Payer: Self-pay

## 2024-12-06 ENCOUNTER — Inpatient Hospital Stay: Attending: Nurse Practitioner

## 2024-12-06 ENCOUNTER — Inpatient Hospital Stay

## 2024-12-06 DIAGNOSIS — R760 Raised antibody titer: Secondary | ICD-10-CM

## 2024-12-07 LAB — ENA+DNA/DS+SJORGEN'S
ENA SM Ab Ser-aCnc: <0.2 AI (ref 0.0–0.9)
Ribonucleic Protein: <0.2 AI (ref 0.0–0.9)
SSA (Ro) (ENA) Antibody, IgG: <0.2 AI (ref 0.0–0.9)
SSB (La) (ENA) Antibody, IgG: <0.2 AI (ref 0.0–0.9)
ds DNA Ab: 3 [IU]/mL (ref 0–9)

## 2024-12-07 LAB — ANA W/REFLEX: Anti Nuclear Antibody (ANA): POSITIVE — AB

## 2024-12-08 LAB — CARDIOLIPIN ANTIBODIES, IGG, IGM, IGA
Anticardiolipin IgA: <9 U/mL (ref 0–11)
Anticardiolipin IgG: <9 GPL U/mL (ref 0–14)
Anticardiolipin IgM: <9 [MPL'U]/mL (ref 0–12)

## 2024-12-18 ENCOUNTER — Inpatient Hospital Stay: Admitting: Hematology
